# Patient Record
Sex: Female | Born: 1970
Health system: Southern US, Community
[De-identification: ages and names within clinical notes are randomized; demographics above are authoritative.]

## PROBLEM LIST (undated history)

## (undated) DIAGNOSIS — F209 Schizophrenia, unspecified: Secondary | ICD-10-CM

## (undated) DIAGNOSIS — E7800 Pure hypercholesterolemia, unspecified: Secondary | ICD-10-CM

## (undated) DIAGNOSIS — F319 Bipolar disorder, unspecified: Secondary | ICD-10-CM

## (undated) DIAGNOSIS — I10 Essential (primary) hypertension: Secondary | ICD-10-CM

---

## 1998-12-22 ENCOUNTER — Encounter: Payer: Self-pay | Admitting: Emergency Medicine

## 1998-12-22 ENCOUNTER — Emergency Department (HOSPITAL_COMMUNITY): Admission: EM | Admit: 1998-12-22 | Discharge: 1998-12-22 | Payer: Self-pay | Admitting: Emergency Medicine

## 1999-07-28 ENCOUNTER — Emergency Department (HOSPITAL_COMMUNITY): Admission: EM | Admit: 1999-07-28 | Discharge: 1999-07-28 | Payer: Self-pay | Admitting: Emergency Medicine

## 1999-11-19 ENCOUNTER — Emergency Department (HOSPITAL_COMMUNITY): Admission: EM | Admit: 1999-11-19 | Discharge: 1999-11-19 | Payer: Self-pay | Admitting: Emergency Medicine

## 1999-11-20 ENCOUNTER — Inpatient Hospital Stay (HOSPITAL_COMMUNITY): Admission: EM | Admit: 1999-11-20 | Discharge: 1999-11-22 | Payer: Self-pay | Admitting: *Deleted

## 2000-12-01 ENCOUNTER — Emergency Department (HOSPITAL_COMMUNITY): Admission: EM | Admit: 2000-12-01 | Discharge: 2000-12-02 | Payer: Self-pay | Admitting: *Deleted

## 2002-01-23 ENCOUNTER — Encounter: Payer: Self-pay | Admitting: Emergency Medicine

## 2002-01-24 ENCOUNTER — Emergency Department (HOSPITAL_COMMUNITY): Admission: EM | Admit: 2002-01-24 | Discharge: 2002-01-24 | Payer: Self-pay | Admitting: Emergency Medicine

## 2002-04-08 ENCOUNTER — Inpatient Hospital Stay (HOSPITAL_COMMUNITY): Admission: EM | Admit: 2002-04-08 | Discharge: 2002-04-12 | Payer: Self-pay | Admitting: Psychiatry

## 2003-02-25 ENCOUNTER — Inpatient Hospital Stay (HOSPITAL_COMMUNITY): Admission: EM | Admit: 2003-02-25 | Discharge: 2003-02-27 | Payer: Self-pay | Admitting: Psychiatry

## 2003-04-05 ENCOUNTER — Emergency Department (HOSPITAL_COMMUNITY): Admission: EM | Admit: 2003-04-05 | Discharge: 2003-04-05 | Payer: Self-pay

## 2003-04-21 ENCOUNTER — Emergency Department (HOSPITAL_COMMUNITY): Admission: EM | Admit: 2003-04-21 | Discharge: 2003-04-21 | Payer: Self-pay | Admitting: Emergency Medicine

## 2004-01-09 ENCOUNTER — Emergency Department (HOSPITAL_COMMUNITY): Admission: EM | Admit: 2004-01-09 | Discharge: 2004-01-09 | Payer: Self-pay | Admitting: Emergency Medicine

## 2004-08-01 ENCOUNTER — Emergency Department (HOSPITAL_COMMUNITY): Admission: EM | Admit: 2004-08-01 | Discharge: 2004-08-02 | Payer: Self-pay | Admitting: Emergency Medicine

## 2004-08-08 ENCOUNTER — Emergency Department (HOSPITAL_COMMUNITY): Admission: EM | Admit: 2004-08-08 | Discharge: 2004-08-08 | Payer: Self-pay | Admitting: Emergency Medicine

## 2004-08-12 ENCOUNTER — Emergency Department (HOSPITAL_COMMUNITY): Admission: EM | Admit: 2004-08-12 | Discharge: 2004-08-12 | Payer: Self-pay | Admitting: Emergency Medicine

## 2004-08-14 ENCOUNTER — Ambulatory Visit: Payer: Self-pay | Admitting: Family Medicine

## 2004-08-20 ENCOUNTER — Emergency Department (HOSPITAL_COMMUNITY): Admission: EM | Admit: 2004-08-20 | Discharge: 2004-08-21 | Payer: Self-pay | Admitting: *Deleted

## 2004-09-24 ENCOUNTER — Inpatient Hospital Stay (HOSPITAL_COMMUNITY): Admission: AD | Admit: 2004-09-24 | Discharge: 2004-09-24 | Payer: Self-pay | Admitting: Obstetrics and Gynecology

## 2004-09-27 ENCOUNTER — Inpatient Hospital Stay (HOSPITAL_COMMUNITY): Admission: AD | Admit: 2004-09-27 | Discharge: 2004-09-27 | Payer: Self-pay | Admitting: Internal Medicine

## 2004-09-30 ENCOUNTER — Emergency Department (HOSPITAL_COMMUNITY): Admission: EM | Admit: 2004-09-30 | Discharge: 2004-09-30 | Payer: Self-pay | Admitting: Emergency Medicine

## 2004-10-03 ENCOUNTER — Ambulatory Visit: Payer: Self-pay | Admitting: Family Medicine

## 2004-10-09 ENCOUNTER — Ambulatory Visit (HOSPITAL_COMMUNITY): Admission: RE | Admit: 2004-10-09 | Discharge: 2004-10-09 | Payer: Self-pay | Admitting: Internal Medicine

## 2004-10-23 ENCOUNTER — Ambulatory Visit: Payer: Self-pay | Admitting: Family Medicine

## 2006-10-16 ENCOUNTER — Inpatient Hospital Stay (HOSPITAL_COMMUNITY): Admission: EM | Admit: 2006-10-16 | Discharge: 2006-10-18 | Payer: Self-pay | Admitting: Emergency Medicine

## 2006-10-16 ENCOUNTER — Ambulatory Visit: Payer: Self-pay | Admitting: Internal Medicine

## 2006-10-18 ENCOUNTER — Inpatient Hospital Stay (HOSPITAL_COMMUNITY): Admission: EM | Admit: 2006-10-18 | Discharge: 2006-10-20 | Payer: Self-pay | Admitting: Emergency Medicine

## 2006-11-03 ENCOUNTER — Ambulatory Visit: Payer: Self-pay | Admitting: Family Medicine

## 2007-01-07 ENCOUNTER — Emergency Department (HOSPITAL_COMMUNITY): Admission: EM | Admit: 2007-01-07 | Discharge: 2007-01-07 | Payer: Self-pay | Admitting: Emergency Medicine

## 2007-01-09 ENCOUNTER — Emergency Department (HOSPITAL_COMMUNITY): Admission: EM | Admit: 2007-01-09 | Discharge: 2007-01-09 | Payer: Self-pay | Admitting: Emergency Medicine

## 2007-04-23 ENCOUNTER — Emergency Department (HOSPITAL_COMMUNITY): Admission: EM | Admit: 2007-04-23 | Discharge: 2007-04-23 | Payer: Self-pay | Admitting: Family Medicine

## 2007-05-19 ENCOUNTER — Emergency Department (HOSPITAL_COMMUNITY): Admission: EM | Admit: 2007-05-19 | Discharge: 2007-05-19 | Payer: Self-pay | Admitting: Emergency Medicine

## 2007-09-23 ENCOUNTER — Ambulatory Visit: Payer: Self-pay | Admitting: Internal Medicine

## 2007-09-23 ENCOUNTER — Encounter (INDEPENDENT_AMBULATORY_CARE_PROVIDER_SITE_OTHER): Payer: Self-pay | Admitting: Family Medicine

## 2007-09-23 LAB — CONVERTED CEMR LAB
Albumin: 4 g/dL (ref 3.5–5.2)
Alkaline Phosphatase: 46 units/L (ref 39–117)
BUN: 10 mg/dL (ref 6–23)
Basophils Absolute: 0 10*3/uL (ref 0.0–0.1)
Basophils Relative: 0 % (ref 0–1)
CO2: 22 meq/L (ref 19–32)
Calcium: 9.3 mg/dL (ref 8.4–10.5)
HCT: 29.9 % — ABNORMAL LOW (ref 36.0–46.0)
Hemoglobin: 9.2 g/dL — ABNORMAL LOW (ref 12.0–15.0)
Lymphocytes Relative: 36 % (ref 12–46)
MCHC: 30.8 g/dL (ref 30.0–36.0)
MCV: 69.9 fL — ABNORMAL LOW (ref 78.0–100.0)
Monocytes Absolute: 0.6 10*3/uL (ref 0.1–1.0)
Potassium: 4.4 meq/L (ref 3.5–5.3)
RDW: 15.8 % — ABNORMAL HIGH (ref 11.5–15.5)
TSH: 0.972 microintl units/mL (ref 0.350–5.50)
Total Bilirubin: 0.5 mg/dL (ref 0.3–1.2)
WBC: 5.1 10*3/uL (ref 4.0–10.5)

## 2007-11-10 ENCOUNTER — Encounter (INDEPENDENT_AMBULATORY_CARE_PROVIDER_SITE_OTHER): Payer: Self-pay | Admitting: Family Medicine

## 2007-11-10 ENCOUNTER — Ambulatory Visit: Payer: Self-pay | Admitting: Internal Medicine

## 2007-11-10 LAB — CONVERTED CEMR LAB
Basophils Absolute: 0 10*3/uL (ref 0.0–0.1)
Basophils Relative: 0 % (ref 0–1)
Cholesterol: 183 mg/dL (ref 0–200)
HDL: 31 mg/dL — ABNORMAL LOW (ref >39)
Hemoglobin: 10.1 g/dL — ABNORMAL LOW (ref 12.0–15.0)
Iron: 48 ug/dL (ref 42–145)
LDL Cholesterol: 110 mg/dL — ABNORMAL HIGH (ref 0–99)
Lymphs Abs: 2 10*3/uL (ref 0.7–4.0)
Monocytes Relative: 8 % (ref 3–12)
Neutrophils Relative %: 54 % (ref 43–77)
Platelets: 527 10*3/uL — ABNORMAL HIGH (ref 150–400)
RBC: 4.66 M/uL (ref 3.87–5.11)
RDW: 15.9 % — ABNORMAL HIGH (ref 11.5–15.5)
Triglycerides: 211 mg/dL — ABNORMAL HIGH (ref <150)
UIBC: 422 ug/dL
WBC: 6.1 10*3/uL (ref 4.0–10.5)

## 2007-12-30 ENCOUNTER — Ambulatory Visit: Payer: Self-pay | Admitting: Obstetrics and Gynecology

## 2008-01-06 ENCOUNTER — Ambulatory Visit: Payer: Self-pay | Admitting: Obstetrics and Gynecology

## 2008-01-06 ENCOUNTER — Ambulatory Visit (HOSPITAL_COMMUNITY): Admission: RE | Admit: 2008-01-06 | Discharge: 2008-01-06 | Payer: Self-pay | Admitting: Obstetrics and Gynecology

## 2008-01-06 ENCOUNTER — Encounter: Payer: Self-pay | Admitting: Obstetrics and Gynecology

## 2008-01-12 ENCOUNTER — Emergency Department (HOSPITAL_COMMUNITY): Admission: EM | Admit: 2008-01-12 | Discharge: 2008-01-12 | Payer: Self-pay | Admitting: Emergency Medicine

## 2008-10-06 ENCOUNTER — Emergency Department (HOSPITAL_COMMUNITY): Admission: EM | Admit: 2008-10-06 | Discharge: 2008-10-06 | Payer: Self-pay | Admitting: Family Medicine

## 2008-11-08 ENCOUNTER — Emergency Department (HOSPITAL_COMMUNITY): Admission: EM | Admit: 2008-11-08 | Discharge: 2008-11-08 | Payer: Self-pay | Admitting: Emergency Medicine

## 2008-11-17 ENCOUNTER — Emergency Department (HOSPITAL_COMMUNITY): Admission: EM | Admit: 2008-11-17 | Discharge: 2008-11-17 | Payer: Self-pay | Admitting: Emergency Medicine

## 2009-02-28 ENCOUNTER — Emergency Department (HOSPITAL_COMMUNITY): Admission: EM | Admit: 2009-02-28 | Discharge: 2009-02-28 | Payer: Self-pay | Admitting: Emergency Medicine

## 2009-05-04 ENCOUNTER — Emergency Department (HOSPITAL_COMMUNITY): Admission: EM | Admit: 2009-05-04 | Discharge: 2009-05-04 | Payer: Self-pay | Admitting: Family Medicine

## 2009-11-01 ENCOUNTER — Emergency Department (HOSPITAL_COMMUNITY): Admission: EM | Admit: 2009-11-01 | Discharge: 2009-11-01 | Payer: Self-pay | Admitting: Family Medicine

## 2009-11-07 ENCOUNTER — Ambulatory Visit: Payer: Self-pay | Admitting: Internal Medicine

## 2009-11-08 ENCOUNTER — Encounter (INDEPENDENT_AMBULATORY_CARE_PROVIDER_SITE_OTHER): Payer: Self-pay | Admitting: Adult Health

## 2009-11-08 ENCOUNTER — Ambulatory Visit: Payer: Self-pay | Admitting: Internal Medicine

## 2009-11-08 LAB — CONVERTED CEMR LAB
CO2: 23 meq/L (ref 19–32)
Chloride: 104 meq/L (ref 96–112)
Creatinine, Ser: 0.69 mg/dL (ref 0.40–1.20)
Glucose, Bld: 77 mg/dL (ref 70–99)
Microalb, Ur: 5.6 mg/dL — ABNORMAL HIGH (ref 0.00–1.89)

## 2009-12-09 ENCOUNTER — Emergency Department (HOSPITAL_COMMUNITY): Admission: EM | Admit: 2009-12-09 | Discharge: 2009-12-09 | Payer: Self-pay | Admitting: Emergency Medicine

## 2009-12-26 ENCOUNTER — Encounter (INDEPENDENT_AMBULATORY_CARE_PROVIDER_SITE_OTHER): Payer: Self-pay | Admitting: Adult Health

## 2009-12-26 ENCOUNTER — Ambulatory Visit: Payer: Self-pay | Admitting: Internal Medicine

## 2009-12-26 LAB — CONVERTED CEMR LAB
ALT: 13 units/L (ref 0–35)
AST: 21 units/L (ref 0–37)
Alkaline Phosphatase: 41 units/L (ref 39–117)
BUN: 15 mg/dL (ref 6–23)
CO2: 23 meq/L (ref 19–32)
Calcium: 10 mg/dL (ref 8.4–10.5)
Cholesterol: 200 mg/dL (ref 0–200)
Creatinine, Ser: 1.02 mg/dL (ref 0.40–1.20)
Potassium: 4.1 meq/L (ref 3.5–5.3)
Sodium: 135 meq/L (ref 135–145)
Total Bilirubin: 0.7 mg/dL (ref 0.3–1.2)

## 2010-01-04 ENCOUNTER — Ambulatory Visit: Payer: Self-pay | Admitting: Family Medicine

## 2010-02-20 ENCOUNTER — Ambulatory Visit: Payer: Self-pay | Admitting: Internal Medicine

## 2010-09-04 ENCOUNTER — Emergency Department (HOSPITAL_COMMUNITY)
Admission: EM | Admit: 2010-09-04 | Discharge: 2010-09-04 | Payer: Self-pay | Source: Home / Self Care | Admitting: Emergency Medicine

## 2010-09-16 ENCOUNTER — Encounter: Payer: Self-pay | Admitting: Internal Medicine

## 2010-12-11 LAB — CULTURE, ROUTINE-ABSCESS

## 2011-01-08 NOTE — Group Therapy Note (Signed)
NAMEYLONDA, STORR NO.:  [id number]   MEDICAL RECORD NO.:  [id number]          PATIENT TYPE:  WOC   LOCATION:  [facility]                   FACILITY:  [facility]   PHYSICIAN:  Argentina Donovan, MD        DATE OF BIRTH:  [birth year]   DATE OF SERVICE:  01/06/2008                                  CLINIC NOTE   The patient is a 40 year old African American female, gravida 3, para 3-  0-0-3, who was referred by [facility] because of possible fibroids with  a history of an ovarian mass.  The ultrasound showed resolution of the  left ovarian cyst since a prior exam 1 year ago, mildly enlarged uterus  with 4 discrete fibroids measuring up to 4.5 cm but no significant  change from the previous year-ago ultrasound.   EXAMINATION:  The external genitalia is normal.  BUS within normal  limits.  Vagina is clean and well-rugated.  Cervix clean, parous.  The  uterus could not be well-outlined cause of habitus of the patient, nor  could the adnexa be palpated.  The cul-de-sac was free.  A Pap smear was  taken.   The patient has no complaints of any abnormal bleeding and will be seen  annually for her Pap smear.   DIAGNOSES:  1. Asymptomatic leiomyomata uteri.  2. Otherwise normal gynecological examination.  3. Resolution of ovarian cyst.  4. Papanicolaou smear taken.           ______________________________  Argentina Donovan, MD     PR/MEDQ  D:  01/06/2008  T:  01/06/2008  Job:  [id number]

## 2011-01-11 NOTE — Discharge Summary (Signed)
Angela Conner, Angela Conner                        ACCOUNT NO.:  [id number]   MEDICAL RECORD NO.:  [id number]                  PATIENT TYPE:  IPS   LOCATION:  0502                                 FACILITY:  [facility]   PHYSICIAN:  Geoffery Lyons, MD                     DATE OF BIRTH:  April 22, 1971   DATE OF ADMISSION:  04/08/2002  DATE OF DISCHARGE:  04/12/2002                                 DISCHARGE SUMMARY   CHIEF COMPLAINT AND PRESENT ILLNESS:  This was the second admission to [facility] for this 40 year old African-American female,  single, voluntarily admitted, who stated that the voices were telling her to  kill herself and her children.  She ingested an unknown amount of ibuprofen  and four Tylenol in an intentional suicide attempt.  She was medically  cleared.  She had a history of hospitalization one time previously and did  not record the medications she was given.  There were other overdose  attempts untreated.  She had 42 years' history of depression,  worse in the past year since she left prison after a 45 day term for  embezzlement.  She had increased irritability, poor sleep, generalized  insomnia, increased anhedonia, poor concentration.  She heard voices telling  her to harm herself or just to leave the house.  She had no visual  hallucinations.  She smoked marijuana, no alcohol or cocaine.   PAST PSYCHIATRIC HISTORY:  She had no current outpatient treatment.  She had  been at [facility] two or three years ago.   SUBSTANCE ABUSE HISTORY:  She abused marijuana.   PAST MEDICAL HISTORY:  Noncontributory.   MEDICATIONS:  She was not taking any medications.   MENTAL STATUS EXAM:  Mental status exam revealed an obese African-American  female.  She was in no acute distress, fully alert, blunted affect, poor eye  contact, apathetic.  She was passively cooperative in manner.  Speech was  normal in tone without pressure.  She did have some  difficulty giving  answers, some thought blocking.  Mood was withdrawn, depressed,  distractible.  She seemed to be suspicious and guarded.  She admitted to  suicidal rumination but could contract for safety.  Cognitive: Cognition was  well preserved.   ADMISSION DIAGNOSES:   AXIS I:  1. Major depression, recurrent with psychotic features.  2. Marijuana abuse.   AXIS II:  No diagnosis.   AXIS III:  1. Hypokalemia.  2. Hypertension by history.  3. Microcytic anemia.  4. Headache.   AXIS IV:  Moderate.   AXIS V:  Global assessment of functioning upon admission 29, highest global  assessment of functioning in the last year 62.   LABORATORY DATA:  Other laboratory workup: CBC: Hemoglobin 9.2, hematocrit  28.0.  Thyroid profile was within normal limits.  EKG: Within normal limits.   HOSPITAL COURSE:  She  was admitted and started in intensive individual and  group psychotherapy.  She was placed on Risperdal 0.25 mg at noon and 0.5 mg  at night, Lexapro 10 mg daily.  She was given iron multivitamin, Toprol XR  25 mg daily.  Risperdal was increased to 1 mg at bedtime, Ambien was  discontinued.  She claimed to have heard voices for a long time but she had  been keeping it from people.  She was frustrated because she could not find  a job, lots of environmental stressors.  As the medications got into her  system, she felt she was feeling better, the voices had improved, more aware  of the need to pursue outpatient treatment.  By August 18, she was much  improved, in full contact with reality, no suicidal ideas, no homicidal  ideas, mood euthymic, reported no auditory hallucinations.  She said she was  ready to go home and pursue outpatient treatment.  As she denied any  suicidal or homicidal ideas and there was no evidence of acute or further  hallucinations, we discharged her to outpatient followup.   DISCHARGE DIAGNOSES:   AXIS I:  1. Major depression, recurrent with psychotic  features.  2. Marijuana abuse.   AXIS II:  No diagnosis.   AXIS III:  1. Anemia.  2. Headaches.  3. Arterial hypertension by history.   AXIS IV:  Moderate.   AXIS V:  Global assessment of functioning upon discharge 50-55.   DISCHARGE MEDICATIONS:  1. Lexapro 10 mg daily.  2. Iron 325 mg one three times a day.  3. Multivitamin one daily.  4. Toprol XL 25 mg daily.  5. Risperdal 1 mg at bedtime.   FOLLOW UP:  She was to follow-up at [facility].                                               Geoffery Lyons, MD    IL/MEDQ  D:  05/10/2002  T:  05/11/2002  Job:  [id number]

## 2011-01-11 NOTE — Discharge Summary (Signed)
NAMERISHIKA, MCCOLLOM              ACCOUNT NO.:  [id number]   MEDICAL RECORD NO.:  [id number]          PATIENT TYPE:  INP   LOCATION:  5703                         FACILITY:  [facility]   PHYSICIAN:  Duncan Dull, M.D.     DATE OF BIRTH:  [birth year]   DATE OF ADMISSION:  10/18/2006  DATE OF DISCHARGE:  10/20/2006                               DISCHARGE SUMMARY   The patient left AMA on February 23 and returned the same day, and she  was readmitted.  However, this is considered only one admission  secondary to one persisting active problem.   DISCHARGE DIAGNOSES:  1. Pneumonia.  2. Bilateral mediastinal adenopathy.  3. Sinus tachycardia  4. Microcytic anemia.  5. Uterine fibroids, calcified.  6. Ovarian loculated cyst.  7. Bipolar disorder.  8. Metabolic acidosis, resolved.  9. Right lateral back pain, improved.   CONDITION ON DISCHARGE:  Improved.   DISCHARGE MEDICATIONS:  1. Avelox 400 mg p.o. daily for 7 days.  2. Albuterol MDI 1 puffs q. 4 h p.r.n. shortness of breath.   FOLLOWUP:  The patient has an appointment with Dr. Margarette Canada at  [facility] as hospital followup on October 22, 2006, at 10 a.m. and  also an appointment with Dr. Okey Dupre or Dr.  Perlie Gold of [facility] on  November 05, 2006, at 1:15 p.m.  At that time Dr. Okey Dupre or Dr. Perlie Gold  should follow up on her OB-GYN problems of calcified fibroids and  ovarian cyst.  Dr. Margarette Canada should check on CBC as well as white count  and also anemia.   PROCEDURES:  1. October 16, 2006, chest x-ray showed slight diffuse bronchitis, a      2 cm right suprahilar ill defined nodule with slight bilateral and      right hilar and mediastinal adenopathy.  Differential diagnoses      included sarcoidosis, lymphoproliferative disease, primary lung      carcinoma, and infectious inflammatory etiology. Stable slight      scoliosis.  2. Abdominal x-ray shows stable calcified uterine fibroids, otherwise      negative.  3. CT of the  chest with contrast showed extensive mediastinal and      hilar adenopathy.  In the differential, sarcoidosis,      lymphoproliferative disease, lymphoma, Castleman disease, and      probably less likely primary lung carcinoma.  Right upper lobar      consolidation most likely representing pneumonia; however,      possibility of other causes; for example  alveolar sarcoid is      consideration as well.  The patient will need to follow up with a      chest x-ray to assess for complete clearing of the consolidation.  4. CT of the abdomen with contrast showed mild hepatomegaly and mild,      diffuse fatty infiltration of the liver.  No space-occupying      hepatic lesions.  Normal spleen.  Mild left periaortic      retroperitoneal adenopathy and no pathologically enlarged abdominal      lymph nodes.  5. Pelvic CT with  contrast showed fibroid uterus, complex left ovarian      mass of indeterminate etiology.  A leading consideration would be      ovarian cyst adenoma.  Cannot exclude cyst adenocarcinoma.      Consider pelvic ultrasound for further evaluation and minimally      prominent size right femoral lymph node.  6. Transvaginal ultrasound on October 17, 2006, showed fibroid      uterus, thickened endometrium measuring up to 2.7 cm, slightly      complex ovarian cyst on the right of 3.7 x 3.6 x 3.5 cm.  It may be      a hemorrhagic cyst.  Ultrasound followup in 3 months is suggested      to reevaluate this cyst.  7. October 20, 2006, a chest x-ray shows improving right upper lobe      airspace consolidation with small right pleural effusion and      possible fluid in the minor fissure.   CONSULTATIONS:  None.   ADMITTING HISTORY OF PRESENT ILLNESS:  Ms.  Decaprio is a 40 year old  African-American woman with past medical history of bipolar disorder and  calcified uterine fibroids by CT of the abdomen in December 2005 also  microcytic anemia secondary to fibroids, presenting to the  ED  complaining of dyspnea, chest tightness, mild productive cough with  white sputum, and shortness of breath since 4 days prior to admission.  Apparently her children were down with a flu-like illness for the week  preceding admission.  The day before admission, her symptoms took a turn  for the worse, and the dyspnea became very severe.  She tried over-the-  counter NyQuil the day prior to admission.  She also had one episode of  nausea but no hematochezia or hematemesis.   PHYSICAL EXAMINATION:  VITAL SIGNS: Temperature 101, blood pressure  145/84, pulse 160, respiratory rate 30, oxygen saturation 98% on room  air.  GENERAL:  She was very tremulous, warm to touch, in moderate distress.  HEENT: Eyes PERRLA.  Extraocular movements intact.  Pallor to  conjunctivae.  ENT: Oropharynx pale, no oral lesions or ulcers.  NECK: Supple, no thyromegaly, no adenopathy.  LUNGS:  Bibasilar breath sounds extending to half of her chest with  crackles and expiratory wheezing diffusely.  CARDIOVASCULAR:  She was tachycardic but regular.  No murmurs, rubs, or  gallops.  Strong bilateral radial pulses.  ABDOMEN: Soft, no guarding to right upper quadrant.  Positive bowel  sounds throughout.  No rebound.  No obvious hepatosplenomegaly.  EXTREMITIES: No edema.  SKIN:  No rashes, no petechiae.  MUSCULOSKELETAL:  Marked tenderness to right posterolateral lumbar  region to very light touch with no skin lesions.  NEUROLOGIC: Cranial nerves II-XII grossly intact.  No focal deficits.  PSYCH:  She was anxious.   LABORATORY DATA ON ADMISSION:  Sodium 133, potassium 3.9, chloride 104,  bicarb 17, BUN 8, creatinine 0.9, glucose 113. Anion gap 12.  Bilirubin  0.7, alkaline phosphatase 46, SGOT 26, SGPT 16, protein 7.2, albumin  3.5.  D-dimer 0.47.  Calcium 9.  PT 14.5, PTT 33, INR 1.1.  White blood  count 18, hemoglobin 10, hematocrit 31, thrombocytes 342, ANC 16.2, MCV 70.7, RDW 16.1.  Point-of-care markers:  Myoglobin 93.8, CK-MB less than  1, troponin I less than 0.05.  Urinalysis: Urine was cloudy, density  1.018, small hemoglobin, small leukocyte esterase, 15 ketones, 0-2 white  blood cells, few bacteria, and 0-2 red blood cells.  Pregnancy test was  negative in both serum and urine.  Urine drug screen was positive for  THC two times, at admission and after she left AMA and returned.   HOSPITAL COURSE:  #1.  UPPER RESPIRATORY INFECTION:  Most likely pneumonia.  Initial ABG  shows picture of respiratory alkalosis with chest x-ray findings  suggesting bronchitis and adenopathy, also concerning for pneumonia in  the setting of increased white blood count, fever, and cough.  We  admitted her to telemetry, started her on empiric antibiotics with  Zithromax and Rocephin that we continued for 3 days.  One day before  discharge, we switched her to Avelox, and we will continue this for 7  days.  Total antibiotic days, 10.  We checked sputum cultures and Gram  stain along with blood cultures and urine cultures.  Blood cultures x3  are negative preliminarily.  Sputum cultures were not done inability to  provide specimen.  Nasal swabs were negative.  We also gave her Xopenex  therapy 3 times a day and q. 4 h p.r.n., also provided her with Tylenol  and Percocet for back pain.  Before discharge, repeat chest x-ray showed  improved right upper lobe infiltrate, right pleural effusion, and fluid  in minor fissure which is maintained.  The patient was feeling much  better right before discharge.  We have checked oxygen saturation with  ambulation and with resting, and she was saturating between 94 and 98 on  room air.   #2.  TACHYCARDIA: She came with a pulse of 160.  She was placed on  cardiac monitor, and we obtained an EKG that showed only sinus  tachycardia, otherwise normal EKG.  Cardiac enzymes were negative, and  she did not complain of specific chest pain.  During her hospital stay,  her pulse  remained around 100, down to 80 when she was sleeping.  However, on the day of discharge, we asked her to ambulate to check her  oxygen saturation, and her pulse oscillated between 100 and 126.  We sat  her down and checked orthostatics, and she was found to be orthostatic  by pulse.  We wanted to keep her in the hospital longer to give her  normal saline bolus, but she refused this because she was in a rush to  go home.  She was advised to drink a lot of fluids.  At this point, we  believe that her tachycardia was probably because of her dehydration.  However, she tested positive for marijuana, and this can be cause, too.   #3.  ANEMIA:  This is microcytic with an MCV of 70.7.  It is a chronic  problem for her and is probably caused by menorrhagia from her uterine  fibroids. We set up an appointment in the OB-GYN clinic to be followed  up for this problem.  On CT, the uterine fibroids appear calcified.  #4.  OVARIAN CYST:  This appears to be loculated cyst on ultrasound, and  we recommend that she be followed for this problem, especially since  this looks suspicious for ovarian cancer.   #5.  HILAR ADENOPATHY:  The patient has several enlarged lymph nodes in  her mediastinum and under the diaphragm.  This is suspicious for  lymphoma or a cancerous process or sarcoidosis.  We have checked an ACE  level that was normal, so we cannot be sure at this point about the  etiology.  This needs to be investigated actively in the future.  She  will need to  have another CT of her chest in a month to see if the  lymphadenopathy resolves along with the pneumonia.  Also, we suggested  the patient have lymph node biopsy preferably to delineate diagnosis.  Of note, we have checked an HIV test in the hospital, and that was  nonreactive.   #6.  DISPOSITION:  The patient left AMA on the third day after  admission, and she presented the same day, going through the ED and  returning to the teaching  service.  She returned for increased shortness  of breath and for wheezing.  In the ED, she received Solu-Medrol 125 mg  IV, and then on the floor she was placed on the same regimen of  antibiotics she was on before.   #7.  PROPHYLAXIS:  She was on PAS hose for DVT prophylaxis and Protonix  for GI prophylaxis.   LABORATORY DATA AT DISCHARGE:  T-max 98.5.  Blood pressure 139-167  systolic and diastolic 85-93.  Pulse 70-81; however, with ambulation,  pulse increased 100-126.  Respiratory rate 18.  Oxygen saturation was  99% on room air, and she was 94-98% with ambulation.  White blood count  11.2 which is down from 18 on admission.  Hemoglobin 8.6, hematocrit 26.5, MCV 70.1, thrombocytes 512.  Sodium  136, potassium 4.1, chloride 107, bicarb 25, BUN 7, creatinine 0.69,  glucose 87, calcium 9.1.  TSH 1, free T4 1.01.  Ferritin in blood was  20.  Reticulocyte count 1.2%, absolute of 52.1.      Carlus Pavlov, M.D.  Electronically Signed      Duncan Dull, M.D.  Electronically Signed    CG/MEDQ  D:  10/21/2006  T:  10/21/2006  Job:  [id number]   cc:   Clement Husbands, M.D.  Phil D. Okey Dupre, M.D.

## 2011-01-11 NOTE — H&P (Signed)
Angela Conner, Angela Conner                        ACCOUNT NO.:  [id number]   MEDICAL RECORD NO.:  [id number]                   PATIENT TYPE:  IPS   LOCATION:  0406                                 FACILITY:  [facility]   PHYSICIAN:  Geoffery Lyons, M.D.                   DATE OF BIRTH:  11/25/1970   DATE OF ADMISSION:  02/25/2003  DATE OF DISCHARGE:                         PSYCHIATRIC ADMISSION ASSESSMENT   IDENTIFYING INFORMATION:  This is a 40 year old single African American  female voluntarily admitted February 25, 2003.   HISTORY OF PRESENT ILLNESS:  The patient presents with a history of  psychosis, having positive auditory hallucinations of the command type, to  jump off a bridge, run into a river.  She states she is hearing her  grandmother's voice who has passed away.  Also experiencing positive visual  hallucinations, seeing shadows.  She reports some depressive symptoms about  lots of things, but did not elaborate.  The patient did mention problems  with finances.  Her sleep has been decreased to about five hours per night.  Appetite has been fair.  She reports positive auditory hallucinations  currently.   PAST PSYCHIATRIC HISTORY:  Second hospitalization to [facility] this year and 2003, for history of an overdose of Tylenol, and  whatever.   SOCIAL HISTORY:  This is a 40 year old single [place] American female with  three children, ages 67, 68, and 5.  The children are currently with her  girlfriend while she is here.  She lives with her girlfriend.  She is not  working.  She has no legal problems.  She has completed 12 years of  schooling.   FAMILY HISTORY:  Denies.   ALCOHOL AND DRUG HISTORY:  The patient smokes.  Denies any alcohol or drug  use.  She uses cocaine on occasion.  She states she last used two days ago.   PRIMARY CARE PHYSICIAN:  None.   PAST MEDICAL HISTORY:  She reports elevated blood pressure but is not on any  hypertensive medications, but the  patient has been noncompliant with  medications.   DRUG ALLERGIES:  No known allergies.   REVIEW OF SYSTEMS:  The patient reports history of hypertension.  She is a  smoker.  She has smoked heavily for the past eight years.  No neurological  or hematological problems.  No GI or GU.  Last menstrual period was in June.  No musculoskeletal or skin problems.   PHYSICAL EXAMINATION:  VITAL SIGNS:  Height 5 feet 7 inches, weight 321  pounds, last temperature 97.3, heart rate 79, respirations 18, blood  pressure 159/99.  GENERAL:  This is an overweight 40 year old African American female.  Hair  is short, clean.  Negative lymphadenopathy.  CHEST:  Clear to auscultation.  HEART:  Rate is regular rate and rhythm.  ABDOMEN:  Obese, soft, nontender.  NEUROLOGICAL:  Muscular strength and tone is equal bilaterally.  Cranial  nerves are grossly intact.  Able to perform heel-to-shin, normal alternating  movements without any difficulty.  The patient does have tattoos noted to  her forearms.   LABORATORY DATA:  Blood work is ordered to be drawn.   MENTAL STATUS EXAMINATION:  She is an alert, overweight, cooperative,  female, little eye contact.  She is disheveled.  Speech is clear and  concrete.  Mood is anxious.  Mild anxiety noted.  Thought processes,  positive auditory hallucinations, positive visualizations.  No delusions.  No paranoia.  No suicidal or homicidal ideation.  She does not appear to be  actively responding to internal stimuli.  Cognitive function is intact.  Memory is fair.  Judgment is fair.  Insight is fair.   DIAGNOSES:   AXIS I:  1. Rule out schizoaffective disorder.  2. Rule out major depression and psychosis.  3. Rule out substance induced mood disorder.   AXIS II:  Deferred.   AXIS III:  Hypertension per history.   AXIS IV:  Other psychosocial problems.   AXIS V:  1. Current:  30  2. Past year:  82   PLAN:  Involuntary admission for depression and psychosis.   Contract for  safety.  Check q.15 minutes.  Will put the patient on the 400 hall.  Will  stabilize mood and thinking so patient can be safe.  Will initiate  antidepressant and antipsychotic therapy to decrease depressive symptoms.  Monitor blood pressure closely.  Consider session with patient's significant  other.  Medication compliance was discussed.   LENGTH OF STAY:  Tentatively 3-5 days.     Landry Corporal, N.P.                       Geoffery Lyons, M.D.    JO/MEDQ  D:  02/27/2003  T:  02/27/2003  Job:  [id number]

## 2011-01-11 NOTE — Discharge Summary (Signed)
Angela Conner, Angela Conner                        ACCOUNT NO.:  [id number]   MEDICAL RECORD NO.:  [id number]                   PATIENT TYPE:  IPS   LOCATION:  0406                                 FACILITY:  [facility]   PHYSICIAN:  Geoffery Lyons, M.D.                   DATE OF BIRTH:  [birth year]   DATE OF ADMISSION:  02/25/2003  DATE OF DISCHARGE:  02/27/2003                                 DISCHARGE SUMMARY   CHIEF COMPLAINT AND PRESENT ILLNESS:  This is the second admission to [facility] for this 40 year old single African-American female,  voluntarily admitted.  History of psychosis, having positive auditory  hallucinations of command-type, jump off a bridge into a river.  She was  hearing her grandmother's voice, __________ visual hallucinations, seeing  shadows.  Depressive symptoms over lots of things, did not elaborate,  primarily finances.  Decreased sleep.   PAST PSYCHIATRIC HISTORY:  This is her second time to [facility].  History of overdose of Tylenol.   ALCOHOL AND DRUG HISTORY:  Smokes.  Denies any alcohol or drug use.  Uses  cocaine on occasion.   PAST MEDICAL HISTORY:  Elevated blood pressure, not treated.   MEDICATIONS:  She is not taking any medications.   PHYSICAL EXAMINATION:  Physical examination was performed, failed to show  any acute findings.   MENTAL STATUS EXAM:  Reveals an alert, overweight, cooperative female with  good eye contact, disheveled.  Speech is clear, concrete.  Mood is anxious;  mild anxiety noted.  Thought processes are positive for auditory  hallucinations as well as visual hallucinations; no delusions, no paranoia,  no suicidal or homicidal ideas.  Did not appear to be responding to internal  stimuli.   ADMISSION DIAGNOSES:   AXIS I:  1. The patient with psychosis versus schizo-affective disorder.  2. Rule out cocaine abuse.   AXIS II:  No diagnosis.   AXIS III:  Hypertension.   AXIS IV:  Moderate.   AXIS  V:  1. Upon admission 30.  2. Global assessment of functioning last year was 60.   HOSPITAL COURSE:  She was admitted and started in intensive individual and  group psychotherapy.  She was placed on Lexapro 5 mg daily, Seroquel 100 mg  at night, Seroquel 50 mg every six hours as needed, and Ativan 0.5 mg every  six hours.  Seroquel was increased to 150 mg per day.  She continued to  endorse a depressed mood and auditory hallucinations, multiple stressors.  She was going to go home under the care of her friend; they were aware of  the increased stress.  I increased Seroquel to 150 mg.  By February 27, 2003, she  was feeling better, denied any auditory hallucinations, no suicidal and no  homicidal ideas, willing to participate in outpatient treatment and to give  the medication a try.  As she was not  suicidal or homicidal and she was  endorsing no further suicidal ideas, we went ahead and discharged to  outpatient followup.   DISCHARGE DIAGNOSES:   AXIS I:  Major depression, recurrent, with psychotic features.   AXIS II:  No diagnosis.   AXIS III:  Hypertension.   AXIS IV:  Moderate.   AXIS V:  Upon discharge 50.   DISCHARGE MEDICATIONS:  1. Lexapro 10 mg per day.  2. Seroquel 100 mg two at bedtime and 25 mg two in the morning.   FOLLOW UP:  Followup with [facility].                                               Geoffery Lyons, M.D.    IL/MEDQ  D:  03/23/2003  T:  03/24/2003  Job:  [id number]

## 2011-01-11 NOTE — H&P (Signed)
NAMEJOSELLE, DEEDS                        ACCOUNT NO.:  [id number]   MEDICAL RECORD NO.:  [id number]                   PATIENT TYPE:  IPS   LOCATION:  0502                                 FACILITY:  [facility]   PHYSICIAN:  Viviann Spare, NP                DATE OF BIRTH:  23-Nov-1970   DATE OF ADMISSION:  04/08/2002  DATE OF DISCHARGE:  04/12/2002                         PSYCHIATRIC ADMISSION ASSESSMENT   DATE OF ASSESSMENT:  April 09, 2002   CHIEF COMPLAINT:  The patient states today The voices tell me to kill  myself and my children.  I have a split personality.  Sometimes I'm angry,  and sometimes I'm nice.   PATIENT IDENTIFICATION:  This a 40 year old African-American female, single,  voluntary admission.   HISTORY OF PRESENT ILLNESS:  This patient present to the emergency room  after ingesting an unknown amount of ibuprofen or Tylenol tablets in an  intentional suicide attempt.  She was medically cleared in the emergency  room.  She has a prior history of hospitalization one time previously but  she does not recall what medication she was given.  She reports other  overdose attempts that have been untreated.  She reports a two to three year  history of depression that has been worse in the past year since she left  prison after a 45 day term for embezzlement.  She reports over the past  month or two, sharply increasing irritability with poor sleep, generalized  insomnia, increasing anhedonia, poor concentration, and irritability.  She  reports over the past three to four years when she gets extremely stressed,  she hears voices telling her to harm herself, to just leave the house and  also occasionally reports auditory hallucinations to kill herself and her  children.  She denies any visual hallucinations.  She denies any substance  abuse other than smoking marijuana; she denies any use of alcohol or  cocaine.  She denies any history of mania or feelings of being hyper or  panic attacks.   PAST PSYCHIATRIC HISTORY:  The patient has no current outpatient treatment,  no past outpatient treatment with any regularity.  She reports a history of  one prior admission to [facility] probably two or three years ago for an  overdose attempt.  The patient has no memory of previous drug trials.  This  is her second admission to [facility].  She does  have a history of prior suicide attempts by overdose.  We are unable to  locate an old record at this time.   SUBSTANCE ABUSE HISTORY:  The patient endorses abuse of marijuana, smoking  it approximately two to three times per week.  She denies any other  substance abuse.   PAST MEDICAL HISTORY:  The patient has no regular primary care Mikeila Burgen.  She was seen in the emergency room to rule out an acetaminophen or  salicylate overdose  and was medically cleared.  She denies any current  medical problems.  The patient reports past history of hypertension.  She  was treated with medications in the past.  She reports her menses are  regular, seven days each month and quite heavy with her last menstrual  period starting February 27, 2002.  She denies any risk of pregnancy, denies any  risk of sexually transmitted disease.  She uses no contraception.   DRUG ALLERGIES:  None.   REVIEW OF SYSTEMS:  Reveals some chronic constipation, normally having a BM  one time weekly.  The patient complains of chronic headache.   PHYSICAL EXAMINATION:  The patient's physical examination was done at [facility] and was generally unremarkable.   VITAL SIGNS:  On admission to the unit, temperature 98.8, pulse 72,  respirations 24, blood pressure 164/98.  She is 5 feet 7 inches tall and  weighs 214 pounds.   LABORATORY DATA:  The patient's urine drug screen was positive for THC.  It  is noted that she is mildly hypokalemic with a potassium of 3.2.  Her  metabolic panel was otherwise within normal limits.   Glucose was very mildly  elevated at 112 and this was a random specimen taken at approximately 7:00  p.m.  Electrolytes were otherwise normal.  BUN 10, creatinine 0.7.  Liver  enzymes: Normal with an SGOT 25, SGPT 17.  Acetaminophen level was less than  10 in the emergency room and salicylate level less than 4.  Her alcohol  level was less than 5.  Her thyroid panel is currently pending as is her  urinalysis and pregnancy test.   SOCIAL HISTORY:  The patient is originally from [place].  She was raised in  [place].  She is a Engineer, agricultural, single mother.  She has  three children ages 71, 55, and 4 years.  She reports that she currently in a  same sex relationship with a friend and her friend's son, with six  individuals currently in the household.  She does have a history of legal  charges for embezzlement from a job at [employer] and had a 45 day prison  term in 2002.  She denies any other legal charges at this time.  She does  endorse financial stresses, having the only check she currently receives is  from Aid to Dependent Children for her 10 year old.  She receives no other  support for the other two and she receives no check herself.  She has been  unable to find employment since she incurred legal charges.  Her friend that  she lives with is supportive of her and she reports her as an asset.   FAMILY HISTORY:  The patient denies any family history of mental illness or  substance abuse.   MENTAL STATUS EXAM:  This is an obese African-American female who appears to  be her stated age.  She is in no acute distress.  She is fully alert with a  blunted affect and poor eye contact, generally seems apathetic and she is  passively cooperative in manner.  Speech is normal in tone without pressure.  She does have some alogia.  It is very difficult to get her to answer.  This  could be related to some thought blocking.  Mood is withdrawn and depressed. She is mildly guarded and  somewhat irritable.  Thought process is remarkable  for some mild thought blocking.  She is also distractible; could be having  auditory hallucinations but this is unclear as she is actually denying those  at this time.  She does display some mild paranoia and guarding in her  manner.  Her tracking of the conversation is poor.  She is positive for  suicidal ideation and homicidal ideation with no specific intent at this  time or plan.  Cognitive: Intact and oriented x 3.  Insight is poor.  She is  able to contract for safety.  Intelligence is average.  Judgment and impulse  control: Questionable.   ADMISSION DIAGNOSES:  Axis I: 1.  Major depression, recurrent, severe with  psychosis.  1. Tetrahydrocannibinol abuse.  Axis II:    Deferred.  Axis III:   1.  Hypokalemia.  1. Hypertension by history.  2. Microcytic anemia.  3. Headache, not otherwise specified.  4. Constipation, not otherwise specified.  Axis IV:    Moderate occupational problems, having no regular job and  inadequate income.  Having a supportive friend and living situation is an  asset to her.  Axis V:     Current 29, past year 83.   INITIAL PLAN OF CARE:  Plan is to voluntarily admit the patient to stabilize  her mood and to alleviate her auditory hallucinations and to alleviate her  suicidal and homicidal ideation and improve her sleep and neurovegetative  symptoms.  We are going to check a baseline EKG on her.  We will start her  on Risperdal 0.25 mg p.o. at noon and 0.5 mg at h.s. to alleviate her  guardedness, vague paranoia, and her auditory hallucinations and we will  start her on Lexapro 10 mg p.o. q.d. to alleviate her depression.  We will  also check a urine pregnancy test on her and a routine urinalysis.  For her  microcytic anemia, we will check a ferratin level on her and an RBC folate  and will start her on FeSO4 325 mg p.o. t.i.d.  We will add Colace 100 mg  b.i.d. to that because of her problems with  somewhat chronic constipation  and will also put her on Lactulose 1 Tbs daily as needed to prevent  constipation.  For her hypertension, we are going to start her on Toprol XR  25 mg p.o. daily and will watch her vital signs closely.  We will start her  on Gatorade one bottle q.i.d. for her hypokalemia.  We have also started the  patient on Ambien 10 mg p.o. p.r.n. for insomnia.   ESTIMATED LENGTH OF STAY:  Five days.                                               Viviann Spare, NP    MAS/MEDQ  D:  04/09/2002  T:  04/14/2002  Job:  [id number]

## 2011-02-28 ENCOUNTER — Emergency Department (HOSPITAL_COMMUNITY)
Admission: EM | Admit: 2011-02-28 | Discharge: 2011-03-01 | Disposition: A | Discharge status: Home / Self Care | Payer: Medicare Other | Attending: Emergency Medicine | Admitting: Emergency Medicine

## 2011-02-28 DIAGNOSIS — F411 Generalized anxiety disorder: Secondary | ICD-10-CM | POA: Insufficient documentation

## 2011-02-28 DIAGNOSIS — R45851 Suicidal ideations: Secondary | ICD-10-CM | POA: Insufficient documentation

## 2011-02-28 DIAGNOSIS — R443 Hallucinations, unspecified: Secondary | ICD-10-CM | POA: Insufficient documentation

## 2011-02-28 LAB — COMPREHENSIVE METABOLIC PANEL
ALT: 13 U/L (ref 0–35)
AST: 21 U/L (ref 0–37)
Albumin: 3.6 g/dL (ref 3.5–5.2)
BUN: 9 mg/dL (ref 6–23)
CO2: 25 mEq/L (ref 19–32)
Calcium: 8.8 mg/dL (ref 8.4–10.5)
Chloride: 103 mEq/L (ref 96–112)
GFR calc Af Amer: >60 mL/min (ref >60)
GFR calc non Af Amer: >60 mL/min (ref >60)
Potassium: 3.2 mEq/L — ABNORMAL LOW (ref 3.5–5.1)
Sodium: 138 mEq/L (ref 135–145)
Total Protein: 7.4 g/dL (ref 6.0–8.3)

## 2011-02-28 LAB — CBC
Hemoglobin: 8.9 g/dL — ABNORMAL LOW (ref 12.0–15.0)
MCHC: 32.8 g/dL (ref 30.0–36.0)
MCV: 70.6 fL — ABNORMAL LOW (ref 78.0–100.0)
RBC: 3.84 MIL/uL — ABNORMAL LOW (ref 3.87–5.11)
RDW: 15.1 % (ref 11.5–15.5)

## 2011-02-28 LAB — DIFFERENTIAL
Basophils Relative: 0 % (ref 0–1)
Eosinophils Absolute: 0.2 10*3/uL (ref 0.0–0.7)
Eosinophils Relative: 5 % (ref 0–5)
Lymphocytes Relative: 46 % (ref 12–46)
Monocytes Absolute: 0.3 10*3/uL (ref 0.1–1.0)
Neutro Abs: 1.9 10*3/uL (ref 1.7–7.7)

## 2011-02-28 LAB — RAPID URINE DRUG SCREEN, HOSP PERFORMED: Tetrahydrocannabinol: POSITIVE — AB

## 2011-03-01 ENCOUNTER — Inpatient Hospital Stay (HOSPITAL_COMMUNITY): Admission: AD | Admit: 2011-03-01 | Payer: Medicare Other | Source: Home / Self Care | Admitting: Psychiatry

## 2011-04-22 ENCOUNTER — Inpatient Hospital Stay (INDEPENDENT_AMBULATORY_CARE_PROVIDER_SITE_OTHER)
Admission: RE | Admit: 2011-04-22 | Discharge: 2011-04-22 | Disposition: A | Discharge status: Home / Self Care | Payer: Medicaid Other | Source: Ambulatory Visit | Attending: Family Medicine | Admitting: Family Medicine

## 2011-04-22 DIAGNOSIS — H109 Unspecified conjunctivitis: Secondary | ICD-10-CM

## 2011-06-06 LAB — I-STAT 8, (EC8 V) (CONVERTED LAB)
BUN: 16
Bicarbonate: 29.9 — ABNORMAL HIGH
Chloride: 99
Glucose, Bld: 101 — ABNORMAL HIGH
HCT: 35 — ABNORMAL LOW
Operator id: 116391
pCO2, Ven: 42.6 — ABNORMAL LOW
pH, Ven: 7.454 — ABNORMAL HIGH

## 2011-06-06 LAB — POCT PREGNANCY, URINE: Operator id: 235561

## 2011-07-22 ENCOUNTER — Emergency Department (INDEPENDENT_AMBULATORY_CARE_PROVIDER_SITE_OTHER)
Admission: EM | Admit: 2011-07-22 | Discharge: 2011-07-22 | Disposition: A | Discharge status: Home / Self Care | Payer: Medicare Other | Source: Home / Self Care

## 2011-07-22 DIAGNOSIS — L723 Sebaceous cyst: Secondary | ICD-10-CM

## 2011-07-22 DIAGNOSIS — I889 Nonspecific lymphadenitis, unspecified: Secondary | ICD-10-CM

## 2011-07-22 HISTORY — DX: Essential (primary) hypertension: I10

## 2011-07-22 MED ORDER — CEPHALEXIN 500 MG PO CAPS: 500.0000 mg | ORAL_CAPSULE | Freq: Three times a day (TID) | ORAL | Status: AC

## 2011-07-22 NOTE — ED Provider Notes (Signed)
Medical screening examination/treatment/procedure(s) were performed by non-physician practitioner and as supervising physician I was immediately available for consultation/collaboration.   Barkley Bruns MD.    Barkley Bruns, MD 07/22/11 [id number]

## 2011-07-22 NOTE — ED Provider Notes (Signed)
History     CSN: [id number] Arrival date & time: 07/22/2011 12:35 PM   None     Chief Complaint  Patient presents with  . Mass    (Consider location/radiation/quality/duration/timing/severity/associated sxs/prior treatment) HPI Comments: Pt states she has a new lump behind her left ear and under her chin for the last couple of days. No change in size since she discovered them. Has had a lump on her back for a couple of mos - she wonders if they may be related. The lump on her back occasionally itches but is not painful and she has not noticed a change in size. No drainage. She has had some nasal congestion for a few days, but denies sore throat or ear pain. No fever.   The history is provided by the patient.    Past Medical History  Diagnosis Date  . Hypertension     History reviewed. No pertinent past surgical history.  History reviewed. No pertinent family history.  History  Substance Use Topics  . Smoking status: Former Games developer  . Smokeless tobacco: Not on file  . Alcohol Use: No    OB History    Grav Para Term Preterm Abortions TAB SAB Ect Mult Living                  Review of Systems  Constitutional: Negative for fever, chills and fatigue.  HENT: Positive for congestion. Negative for ear pain, sore throat, rhinorrhea, sneezing, postnasal drip and sinus pressure.   Respiratory: Negative for cough and shortness of breath.   Cardiovascular: Negative for chest pain.  Skin: Negative for rash.  Hematological: Positive for adenopathy.    Allergies  Review of patient's allergies indicates no known allergies.  Home Medications   Current Outpatient Rx  Name Route Sig Dispense Refill  . ATENOLOL 100 MG PO TABS Oral Take 100 mg by mouth daily.      . BUPROPION HCL 100 MG PO TABS Oral Take 100 mg by mouth 2 (two) times daily.      . CEPHALEXIN 500 MG PO CAPS Oral Take 1 capsule (500 mg total) by mouth 3 (three) times daily. 30 capsule 0    BP 135/90  Pulse 60   Temp(Src) 98.2 F (36.8 C) (Oral)  Resp 16  SpO2 100%  Physical Exam  Nursing note and vitals reviewed. Constitutional: She appears well-developed and well-nourished. No distress.  HENT:  Head: Normocephalic and atraumatic.  Right Ear: Tympanic membrane, external ear and ear canal normal.  Left Ear: Tympanic membrane, external ear and ear canal normal.  Nose: Nose normal.  Mouth/Throat: Uvula is midline, oropharynx is clear and moist and mucous membranes are normal. No oropharyngeal exudate, posterior oropharyngeal edema or posterior oropharyngeal erythema.  Neck: Neck supple.  Cardiovascular: Normal rate, regular rhythm and normal heart sounds.   Pulmonary/Chest: Effort normal and breath sounds normal. No respiratory distress.  Lymphadenopathy:       Head (right side): No submandibular, no tonsillar, no preauricular, no posterior auricular and no occipital adenopathy present.       Head (left side): Posterior auricular (< 1 cm, smooth and nontender) adenopathy present. No submandibular, no tonsillar, no preauricular and no occipital adenopathy present.    She has no cervical adenopathy.       Right: No supraclavicular adenopathy present.       Left: No supraclavicular adenopathy present.       2 cm mildly tender submental node, smooth and mobile.   Neurological: She  is alert.  Skin: Skin is warm and dry.     Psychiatric: She has a normal mood and affect.    ED Course  Procedures (including critical care time)  Labs Reviewed - No data to display No results found.   1. Lymphadenitis   2. Sebaceous cyst       MDM          Melody Comas, PA 07/22/11 1359

## 2011-07-22 NOTE — ED Notes (Signed)
C/o lump on her back for couple of months (no changes) lump behind her left ear and under chin for couple of days; c/o URI syx and iritis (as per reported eye MD dx)

## 2011-09-05 ENCOUNTER — Encounter (HOSPITAL_COMMUNITY): Payer: Self-pay | Admitting: Emergency Medicine

## 2011-09-05 ENCOUNTER — Emergency Department (INDEPENDENT_AMBULATORY_CARE_PROVIDER_SITE_OTHER)
Admission: EM | Admit: 2011-09-05 | Discharge: 2011-09-05 | Disposition: A | Discharge status: Home / Self Care | Payer: Medicare Other | Source: Home / Self Care | Attending: Family Medicine | Admitting: Family Medicine

## 2011-09-05 DIAGNOSIS — L259 Unspecified contact dermatitis, unspecified cause: Secondary | ICD-10-CM

## 2011-09-05 DIAGNOSIS — L309 Dermatitis, unspecified: Secondary | ICD-10-CM

## 2011-09-05 MED ORDER — HYDROXYZINE HCL 25 MG PO TABS: 25.0000 mg | ORAL_TABLET | Freq: Four times a day (QID) | ORAL | Status: AC | PRN

## 2011-09-05 MED ORDER — TRIAMCINOLONE ACETONIDE 0.1 % EX CREA: TOPICAL_CREAM | Freq: Two times a day (BID) | CUTANEOUS | Status: DC

## 2011-09-05 NOTE — ED Notes (Signed)
PT HERE WITH C/O POS INSECT BITE TO LEFT POST ARM AND X1 TO RIGHT ARM WITH BURN,ITCHY PAIN THAT FLARED UP X 2 DYS.ON EXAM APPEARS TO BE RED RASH.PT USED IBUPROFEN AND HYDROCORTISONE CREAM BUT NOT RELIEVING PAIN

## 2011-09-05 NOTE — ED Provider Notes (Signed)
History     CSN: [id number]  Arrival date & time 09/05/11  1302   First MD Initiated Contact with Patient 09/05/11 1546      Chief Complaint  Patient presents with  . Insect Bite  . Rash    (Consider location/radiation/quality/duration/timing/severity/associated sxs/prior treatment) HPI Comments: Angela Conner presents for evaluation of itchy rash on the posterior aspect of her LEFT upper arm. She denies any new exposure. Reports that it started as itchy, but now burns. She denies any other areas on her body except a single itchy lesion on medial RIGHT upper arm.   Patient is a 41 y.o. female presenting with rash. The history is provided by the patient.  Rash  This is a new problem. The current episode started more than 2 days ago. The problem has not changed since onset.The problem is associated with an unknown factor. There has been no fever. The rash is present on the left arm and right arm. The pain is mild. The pain has been constant since onset. Associated symptoms include itching and pain. She has tried steriods for the symptoms.    Past Medical History  Diagnosis Date  . Hypertension     History reviewed. No pertinent past surgical history.  No family history on file.  History  Substance Use Topics  . Smoking status: Former Games developer  . Smokeless tobacco: Not on file  . Alcohol Use: No    OB History    Grav Para Term Preterm Abortions TAB SAB Ect Mult Living                  Review of Systems  Constitutional: Negative.   HENT: Negative.   Eyes: Negative.   Respiratory: Negative.   Cardiovascular: Negative.   Gastrointestinal: Negative.   Genitourinary: Negative.   Musculoskeletal: Negative.   Skin: Positive for itching and rash.  Neurological: Negative.     Allergies  Review of patient's allergies indicates no known allergies.  Home Medications   Current Outpatient Rx  Name Route Sig Dispense Refill  . ATENOLOL 100 MG PO TABS Oral Take 100 mg by mouth  daily.      . BUPROPION HCL 100 MG PO TABS Oral Take 100 mg by mouth 2 (two) times daily.      Marland Kitchen HYDROXYZINE HCL 25 MG PO TABS Oral Take 1 tablet (25 mg total) by mouth every 6 (six) hours as needed for itching. 12 tablet 0  . TRIAMCINOLONE ACETONIDE 0.1 % EX CREA Topical Apply topically 2 (two) times daily. 45 g 0    BP 133/84  Pulse 87  Temp(Src) 98.1 F (36.7 C) (Oral)  Resp 18  SpO2 99%  Physical Exam  Nursing note and vitals reviewed. Constitutional: She is oriented to person, place, and time. She appears well-developed and well-nourished.  HENT:  Head: Normocephalic and atraumatic.  Eyes: EOM are normal.  Neck: Normal range of motion.  Pulmonary/Chest: Effort normal.  Musculoskeletal: Normal range of motion.  Neurological: She is alert and oriented to person, place, and time.  Skin: Skin is warm and dry. Rash noted. Rash is papular. Rash is not pustular and not vesicular.     Psychiatric: Her behavior is normal.    ED Course  Procedures (including critical care time)  Labs Reviewed - No data to display No results found.   1. Dermatitis       MDM  rx given for triamcinolone and hydroxyzine        Richardo Priest, MD 09/05/11  1647 

## 2011-11-27 ENCOUNTER — Emergency Department (HOSPITAL_COMMUNITY)
Admission: EM | Admit: 2011-11-27 | Discharge: 2011-11-27 | Disposition: A | Discharge status: Home / Self Care | Payer: Medicare Other | Attending: Emergency Medicine | Admitting: Emergency Medicine

## 2011-11-27 ENCOUNTER — Encounter (HOSPITAL_COMMUNITY): Payer: Self-pay

## 2011-11-27 DIAGNOSIS — R739 Hyperglycemia, unspecified: Secondary | ICD-10-CM

## 2011-11-27 DIAGNOSIS — R7309 Other abnormal glucose: Secondary | ICD-10-CM | POA: Insufficient documentation

## 2011-11-27 DIAGNOSIS — R0981 Nasal congestion: Secondary | ICD-10-CM

## 2011-11-27 DIAGNOSIS — I10 Essential (primary) hypertension: Secondary | ICD-10-CM | POA: Insufficient documentation

## 2011-11-27 DIAGNOSIS — J3489 Other specified disorders of nose and nasal sinuses: Secondary | ICD-10-CM | POA: Insufficient documentation

## 2011-11-27 DIAGNOSIS — Z87891 Personal history of nicotine dependence: Secondary | ICD-10-CM | POA: Insufficient documentation

## 2011-11-27 LAB — GLUCOSE, CAPILLARY: Glucose-Capillary: 140 mg/dL — ABNORMAL HIGH (ref 70–99)

## 2011-11-27 MED ORDER — PSEUDOEPHEDRINE HCL 60 MG PO TABS
60.0000 mg | ORAL_TABLET | ORAL | Status: AC
  Administered 2011-11-27: 60 mg via ORAL
  Filled 2011-11-27: qty 1

## 2011-11-27 MED ORDER — PSEUDOEPHEDRINE HCL 60 MG PO TABS: 60.0000 mg | ORAL_TABLET | Freq: Four times a day (QID) | ORAL | Status: DC | PRN

## 2011-11-27 NOTE — ED Notes (Signed)
Patient presents with nasal congestion, shortness of breath, and dry mouth x several days.

## 2011-11-27 NOTE — ED Provider Notes (Signed)
Medical screening examination/treatment/procedure(s) were performed by non-physician practitioner and as supervising physician I was immediately available for consultation/collaboration.   Leigh-Ann Gisell Buehrle, MD 11/27/11 1949 

## 2011-11-27 NOTE — ED Provider Notes (Signed)
History     CSN: [id number]  Arrival date & time 11/27/11  1219   First MD Initiated Contact with Patient 11/27/11 1239      Chief Complaint  Patient presents with  . Shortness of Breath  . Sinusitis    (Consider location/radiation/quality/duration/timing/severity/associated sxs/prior treatment) HPI Comments: Patient reports severe nasal congestion x several days.  States since she has to breathe through her mouth and this is making her mouth dry.  States that it doesn't matter how much she drinks, she feels that her mouth is very dry.  States last night she vomited x 1, contents of her stomach, and this morning thought she was having a panic attack because she couldn't breath through her nose.  Has been using multiple nasal sprays including Afrin without relief.   Denies fevers, sinus pain, sore throat, cough, SOB.    Patient is a 41 y.o. female presenting with shortness of breath and sinusitis. The history is provided by the patient.  Shortness of Breath  Pertinent negatives include no chest pain, no fever, no sore throat, no cough and no shortness of breath.  Sinusitis  Associated symptoms include congestion. Pertinent negatives include no sore throat, no cough and no shortness of breath.    Past Medical History  Diagnosis Date  . Hypertension     History reviewed. No pertinent past surgical history.  No family history on file.  History  Substance Use Topics  . Smoking status: Former Games developer  . Smokeless tobacco: Not on file  . Alcohol Use: No    OB History    Grav Para Term Preterm Abortions TAB SAB Ect Mult Living                  Review of Systems  Constitutional: Negative for fever.  HENT: Positive for congestion. Negative for sore throat.   Respiratory: Negative for cough and shortness of breath.   Cardiovascular: Negative for chest pain.  Gastrointestinal: Negative for abdominal pain.  All other systems reviewed and are negative.    Allergies  Review  of patient's allergies indicates no known allergies.  Home Medications   Current Outpatient Rx  Name Route Sig Dispense Refill  . ATENOLOL-CHLORTHALIDONE 50-25 MG PO TABS Oral Take 1 tablet by mouth daily.    . BUPROPION HCL ER (XL) 150 MG PO TB24 Oral Take 150 mg by mouth daily.    . QUETIAPINE FUMARATE 50 MG PO TABS Oral Take 50 mg by mouth at bedtime.      BP 157/88  Pulse 67  Temp(Src) 98.2 F (36.8 C) (Oral)  Resp 20  SpO2 100%  LMP 11/18/2011  Physical Exam  Nursing note and vitals reviewed. Constitutional: She is oriented to person, place, and time. She appears well-developed and well-nourished. No distress.  HENT:  Head: Normocephalic and atraumatic.  Nose: Mucosal edema present. Right sinus exhibits no maxillary sinus tenderness and no frontal sinus tenderness. Left sinus exhibits no maxillary sinus tenderness and no frontal sinus tenderness.  Mouth/Throat: Uvula is midline and oropharynx is clear and moist. No uvula swelling. No oropharyngeal exudate, posterior oropharyngeal edema, posterior oropharyngeal erythema or tonsillar abscesses.       Bilateral mucosal edema.  Nares not patent due to severe edema.    Neck: Trachea normal, normal range of motion and phonation normal. Neck supple. No tracheal tenderness present. No rigidity. No tracheal deviation and normal range of motion present.  Cardiovascular: Normal rate and regular rhythm.   Pulmonary/Chest: Effort normal and  breath sounds normal. No stridor. No respiratory distress. She has no wheezes. She has no rales.  Neurological: She is alert and oriented to person, place, and time.  Skin: She is not diaphoretic.    ED Course  Procedures (including critical care time)  Labs Reviewed  GLUCOSE, CAPILLARY - Abnormal; Notable for the following:    Glucose-Capillary 140 (*)    All other components within normal limits   No results found.   1. Nasal congestion   2. Hyperglycemia       MDM  Patient with  severe nasal congestion.  Pt is afebrile, nontoxic, no sinus tenderness.  Afrin likely not working due to severity of edema.  Mucosa otherwise looks healthy.  PO medications given for congestion.  Pt d/c home with care instructions, to return for worsening condition.  Also hyperglycemic, advised to follow closely with PCP.  Patient verbalizes understanding and agrees with plan.          Rise Patience, [place] 11/27/11 1551

## 2011-11-27 NOTE — ED Notes (Signed)
cbg done/ results 140.

## 2011-11-27 NOTE — Discharge Instructions (Signed)
Please use the prescribed medication in conjunction with Afrin nasal spray to open up your nasal passages.  Keep in mind that you should only use Afrin for 3 days as it will cause a rebound stuffy nose and your body will become dependent on it.   If you develop high fevers, facial pain, or shortness of breath, please return to the ER or see your primary care provider.  You may return to the ER at any time for worsening condition or any new symptoms that concern you.      If you have no primary doctor, here are some resources that may be helpful:  Medicaid-accepting [facility] Providers:   - [facility]- [address]      [phone]      Mon-Fri 9am-7pm, Sat 9am-1pm   - [facility]- [address] [place]      [phone]   - [facility]- [address] [place]      [phone]   [facility]- [address]      [phone]   - Renaye Rakers- [address] [place], [address]      [phone]      Only accepts [place] patients       after they have her name applied to their card   Self Pay (no insurance) in [place]:   - Sickle Cell Patients: Dr Willey Blade, [facility]      [address] [place]      [phone]   - [phone]   - Physician Referral Service- [phone]   - [facility]- [address] [place]      [phone]   [facility]- [address]   - [facility]- see information above      (Speak to [employer] if you do not have insurance)   - [facility]- [address] [place]      [phone]   - [facility] [place]- [address]      [phone]   - [facility]- [address]      [phone]   - Dr Julio Sicks-  [address], [place]      [phone]   - [facility]- [address]      [phone]   - [facility]- [address]      [phone]      Also [address]      [phone]   - [facility]- [address]      [phone]      1st and 3rd Saturday every month, 10am-1pm Other agencies that provide inexpensive medical care:    [facility]  [phone]    [facility]  [phone]    [facility]  [phone]    [facility]  [phone]    [facility]  [phone]  General Information: Finding a doctor when you do not have health insurance can be tricky. Although you are not limited by an insurance plan, you are of course limited by her finances and how much but he can pay out of pocket.  What are your options if you don't have health insurance?   1) Find a Librarian, academic and Pay Out of  Pocket Although you won't have to find out who is covered by your insurance plan, it is a good idea to ask around and get recommendations. You will then need to call the office and see if the doctor you have chosen will accept you as a new patient and what types of options they offer for patients who are self-pay. Some doctors offer discounts or will set up payment plans for their patients who do not have insurance, but you will need to ask so you aren't surprised when you get to your appointment.  2) Contact Your Local Health Department Not all health departments have doctors that can see patients for sick visits, but many do, so it is worth a call to see if yours does. If you don't know where your local health department is, you can check in your phone book. The CDC also has a tool to help you locate your state's health department, and many state websites also have listings of all of their local health departments.  3) Find a Walk-in Clinic If your illness is not likely to be very severe or complicated, you may want to try a walk in clinic. These are popping up all over the country in pharmacies, drugstores, and shopping centers. They're usually staffed by nurse  practitioners or physician assistants that have been trained to treat common illnesses and complaints. They're usually fairly quick and inexpensive. However, if you have serious medical issues or chronic medical problems, these are probably not your best option   High Blood Sugar High blood sugar (hyperglycemia) means that the level of sugar in your blood is higher than it should be. Signs of high blood sugar include:  Feeling thirsty.   Frequent peeing (urinating).   Feeling tired or sleepy.   Dry mouth.   Vision changes.   Feeling weak.   Feeling hungry but losing weight.   Numbness and tingling in your hands or feet.   Headache.  When you ignore these signs, your blood sugar may keep going up. These problems may get worse, and other problems may begin. HOME CARE  Check your blood sugars as told by your doctor. Write down the numbers with the date and time.   Take the right amount of insulin or diabetes pills at the right time. Write down the dose with date and time.   Refill your insulin or diabetes pills before running out.   Watch what you eat. Follow your meal plan.   Drink liquids without sugar, such as water. Check with your doctor if you have kidney or heart disease.   Follow your doctor's orders for exercise. Exercise at the same time of day.   Keep your doctor's appointments.  GET HELP RIGHT AWAY IF:   You have trouble thinking or are confused.   You have fast breathing with fruity smelling breath.   You pass out (faint).   You have 2 to 3 days of high blood sugars and you do not know why.   You have chest pain.   You are feeling sick to your stomach (nauseous) or throwing up (vomiting).   You have sudden vision changes.  MAKE SURE YOU:   Understand these instructions.   Will watch your condition.   Will get help right away if you are not doing well or get worse.  Document Released: 06/09/2009 Document Revised: 08/01/2011 Document Reviewed:  06/09/2009 [facility] Patient Information 2012 [place], [place].

## 2011-12-03 ENCOUNTER — Encounter (HOSPITAL_COMMUNITY): Payer: Self-pay | Admitting: Emergency Medicine

## 2011-12-03 ENCOUNTER — Emergency Department (INDEPENDENT_AMBULATORY_CARE_PROVIDER_SITE_OTHER)
Admission: EM | Admit: 2011-12-03 | Discharge: 2011-12-03 | Disposition: A | Discharge status: Home / Self Care | Payer: Medicare Other | Source: Home / Self Care

## 2011-12-03 DIAGNOSIS — T485X5A Adverse effect of other anti-common-cold drugs, initial encounter: Secondary | ICD-10-CM

## 2011-12-03 DIAGNOSIS — J3489 Other specified disorders of nose and nasal sinuses: Secondary | ICD-10-CM

## 2011-12-03 DIAGNOSIS — K219 Gastro-esophageal reflux disease without esophagitis: Secondary | ICD-10-CM

## 2011-12-03 HISTORY — DX: Schizophrenia, unspecified: F20.9

## 2011-12-03 MED ORDER — OMEPRAZOLE 20 MG PO CPDR: 20.0000 mg | DELAYED_RELEASE_CAPSULE | Freq: Every day | ORAL | Status: DC

## 2011-12-03 MED ORDER — FLUTICASONE PROPIONATE 50 MCG/ACT NA SUSP: 2.0000 | Freq: Every day | NASAL | Status: DC

## 2011-12-03 NOTE — Discharge Instructions (Signed)
You must stop using Afrin or similar nasal sprays. This is causing your nasal congestion and swelling. Your nasal swelling will take a few days to begin improving after stopping use, and may even worsen before improving.  You may use saline nasal spray as needed for dryness. Continue taking Claritin once daily. Begin fluticasone prescription nasal spray today. Keep your appt with your primary care dr at [facility] as planned.  Gastroesophageal Reflux Disease, Adult Gastroesophageal reflux disease (GERD) happens when acid from your stomach goes into your food pipe (esophagus). The acid can cause a burning feeling in your chest. Over time, the acid can make small holes (ulcers) in your food pipe.  HOME CARE  Ask your doctor for advice about:   Losing weight.   Quitting smoking.   Alcohol use.   Avoid foods and drinks that make your problems worse. You may want to avoid:   Caffeine and alcohol.   Chocolate.   Mints.   Garlic and onions.   Spicy foods.   Citrus fruits, such as oranges, lemons, or limes.   Foods that contain tomato, such as sauce, chili, salsa, and pizza.   Fried and fatty foods.   Avoid lying down for 3 hours before you go to bed or before you take a nap.   Eat small meals often, instead of large meals.   Wear loose-fitting clothing. Do not wear anything tight around your waist.   Raise (elevate) the head of your bed 6 to 8 inches with wood blocks. Using extra pillows does not help.   Only take medicines as told by your doctor.   Do not take aspirin or ibuprofen.  GET HELP RIGHT AWAY IF:   You have pain in your arms, neck, jaw, teeth, or back.   Your pain gets worse or changes.   You feel sick to your stomach (nauseous), throw up (vomit), or sweat (diaphoresis).   You feel short of breath, or you pass out (faint).   Your throw up is green, yellow, black, or looks like coffee grounds or blood.   Your poop (stool) is red, bloody, or black.    MAKE SURE YOU:   Understand these instructions.   Will watch your condition.   Will get help right away if you are not doing well or get worse.  Document Released: 01/29/2008 Document Revised: 08/01/2011 Document Reviewed: 03/01/2011 [facility] Patient Information 2012 [place], [place].

## 2011-12-03 NOTE — ED Notes (Signed)
Onset of symptoms one month ago.  Patient c/o congestion, nasal congestion. Denies sore throat, denies ear pain.  Denies fever.  Patient reports multiple complaints.  Heartburn c/o for a few months, dry skin for a few months.  Patient is a client of [facility] with appt for [facility]

## 2011-12-03 NOTE — ED Provider Notes (Signed)
History     CSN: [id number]  Arrival date & time 12/03/11  0955   None     Chief Complaint  Patient presents with  . URI    (Consider location/radiation/quality/duration/timing/severity/associated sxs/prior treatment) HPI Comments: Patient presents today with complaints of nasal congestion and swelling for approximately one month. She states that she has been using Afrin nasal spray for one month, and often multiple times a day. She was seen in the ER 6 days ago. She states she's been taking Claritin and Sudafed daily since then without improvement. She also states that she is sometimes taking more than recommended of these medications . Her nasal mucous is clear with red streaks. Her nose at times feels very dry also. No fever or chills. Patient states that she has been having intermittent heartburn for approximately 4 months, worsening. She has been using condoms. She has an appointment with her primary care provider at [facility] April 19th.    Past Medical History  Diagnosis Date  . Hypertension   . Schizophrenia     History reviewed. No pertinent past surgical history.  No family history on file.  History  Substance Use Topics  . Smoking status: Former Games developer  . Smokeless tobacco: Not on file  . Alcohol Use: No    OB History    Grav Para Term Preterm Abortions TAB SAB Ect Mult Living                  Review of Systems  Constitutional: Negative for fever, chills and fatigue.  HENT: Positive for congestion. Negative for ear pain, sore throat, rhinorrhea, sneezing and sinus pressure.   Respiratory: Negative for cough, shortness of breath and wheezing.   Gastrointestinal: Negative for nausea, vomiting and abdominal pain.    Allergies  Review of patient's allergies indicates no known allergies.  Home Medications   Current Outpatient Rx  Name Route Sig Dispense Refill  . ATENOLOL-CHLORTHALIDONE 50-25 MG PO TABS Oral Take 1 tablet by mouth daily.    . BUPROPION  HCL ER (XL) 150 MG PO TB24 Oral Take 150 mg by mouth daily.    Marland Kitchen FLUTICASONE PROPIONATE 50 MCG/ACT NA SUSP Nasal Place 2 sprays into the nose daily. 16 g 0  . OMEPRAZOLE 20 MG PO CPDR Oral Take 1 capsule (20 mg total) by mouth daily. 30 capsule 0  . QUETIAPINE FUMARATE 50 MG PO TABS Oral Take 50 mg by mouth at bedtime.      BP 132/86  Pulse 98  Temp(Src) 98.3 F (36.8 C) (Oral)  Resp 14  SpO2 100%  LMP 11/18/2011  Physical Exam  Nursing note and vitals reviewed. Constitutional: She appears well-developed and well-nourished. No distress.  HENT:  Head: Normocephalic and atraumatic.  Right Ear: Tympanic membrane, external ear and ear canal normal.  Left Ear: Tympanic membrane, external ear and ear canal normal.  Nose: Mucosal edema (Severe nasal turbinate swelling bilaterally, no erythema or paleness of membranes) present. No rhinorrhea.  Mouth/Throat: Uvula is midline, oropharynx is clear and moist and mucous membranes are normal. No oropharyngeal exudate, posterior oropharyngeal edema or posterior oropharyngeal erythema.  Neck: Neck supple.  Cardiovascular: Normal rate, regular rhythm and normal heart sounds.   Pulmonary/Chest: Effort normal and breath sounds normal. No respiratory distress.  Lymphadenopathy:    She has no cervical adenopathy.  Neurological: She is alert.  Skin: Skin is warm and dry.  Psychiatric: She has a normal mood and affect.    ED Course  Procedures (  including critical care time)  Labs Reviewed - No data to display No results found.   1. Rhinitis medicamentosa   2. GERD (gastroesophageal reflux disease)       MDM  Discussed with pt nasal swelling due to long term use of Afrin nasal spray. That she must immediately d/c use for symptoms to improve. To expect swelling to persist, and possibly worsen for a few days before noticing improvement.          Melody Comas, [place] 12/03/11 1256

## 2011-12-03 NOTE — ED Provider Notes (Signed)
Medical screening examination/treatment/procedure(s) were performed by non-physician practitioner and as supervising physician I was immediately available for consultation/collaboration.  Raynald Blend, MD 12/03/11 1348

## 2011-12-17 ENCOUNTER — Emergency Department (HOSPITAL_COMMUNITY)
Admission: EM | Admit: 2011-12-17 | Discharge: 2011-12-17 | Disposition: A | Discharge status: Home / Self Care | Payer: Medicare Other | Attending: Emergency Medicine | Admitting: Emergency Medicine

## 2011-12-17 ENCOUNTER — Encounter (HOSPITAL_COMMUNITY): Payer: Self-pay | Admitting: *Deleted

## 2011-12-17 DIAGNOSIS — I10 Essential (primary) hypertension: Secondary | ICD-10-CM | POA: Insufficient documentation

## 2011-12-17 DIAGNOSIS — F209 Schizophrenia, unspecified: Secondary | ICD-10-CM

## 2011-12-17 DIAGNOSIS — F319 Bipolar disorder, unspecified: Secondary | ICD-10-CM | POA: Insufficient documentation

## 2011-12-17 DIAGNOSIS — R45851 Suicidal ideations: Secondary | ICD-10-CM | POA: Insufficient documentation

## 2011-12-17 HISTORY — DX: Bipolar disorder, unspecified: F31.9

## 2011-12-17 LAB — RAPID URINE DRUG SCREEN, HOSP PERFORMED
Benzodiazepines: NOT DETECTED
Cocaine: NOT DETECTED

## 2011-12-17 LAB — URINALYSIS, ROUTINE W REFLEX MICROSCOPIC
Glucose, UA: NEGATIVE mg/dL
Leukocytes, UA: NEGATIVE
Nitrite: NEGATIVE
Specific Gravity, Urine: 1.02 (ref 1.005–1.030)
pH: 5.5 (ref 5.0–8.0)

## 2011-12-17 LAB — CBC
HCT: 28.2 % — ABNORMAL LOW (ref 36.0–46.0)
RBC: 4.1 MIL/uL (ref 3.87–5.11)
RDW: 14.9 % (ref 11.5–15.5)
WBC: 3.9 10*3/uL — ABNORMAL LOW (ref 4.0–10.5)

## 2011-12-17 LAB — BASIC METABOLIC PANEL
BUN: 11 mg/dL (ref 6–23)
CO2: 26 mEq/L (ref 19–32)
Chloride: 102 mEq/L (ref 96–112)
GFR calc Af Amer: >90 mL/min (ref >90)
Potassium: 4.6 mEq/L (ref 3.5–5.1)

## 2011-12-17 LAB — URINE MICROSCOPIC-ADD ON

## 2011-12-17 MED ORDER — LORAZEPAM 1 MG PO TABS: 1.0000 mg | ORAL_TABLET | Freq: Three times a day (TID) | ORAL | Status: DC | PRN

## 2011-12-17 MED ORDER — IBUPROFEN 200 MG PO TABS
200.0000 mg | ORAL_TABLET | Freq: Once | ORAL | Status: AC
  Administered 2011-12-17: 200 mg via ORAL
  Filled 2011-12-17: qty 1

## 2011-12-17 MED ORDER — ZOLPIDEM TARTRATE 5 MG PO TABS: 5.0000 mg | ORAL_TABLET | Freq: Every evening | ORAL | Status: DC | PRN

## 2011-12-17 MED ORDER — IBUPROFEN 200 MG PO TABS: 600.0000 mg | ORAL_TABLET | Freq: Three times a day (TID) | ORAL | Status: DC | PRN

## 2011-12-17 MED ORDER — ALUM & MAG HYDROXIDE-SIMETH 200-200-20 MG/5ML PO SUSP: 30.0000 mL | ORAL | Status: DC | PRN

## 2011-12-17 MED ORDER — ONDANSETRON HCL 8 MG PO TABS: 4.0000 mg | ORAL_TABLET | Freq: Three times a day (TID) | ORAL | Status: DC | PRN

## 2011-12-17 NOTE — ED Provider Notes (Signed)
Medical screening examination/treatment/procedure(s) were performed by non-physician practitioner and as supervising physician I was immediately available for consultation/collaboration.    Bill Yohn L Sumi Lye, MD 12/17/11 2029 

## 2011-12-17 NOTE — ED Notes (Signed)
Report received, assumed care.  

## 2011-12-17 NOTE — ED Notes (Signed)
Charge RN notified need of sitter.

## 2011-12-17 NOTE — ED Notes (Addendum)
Diet tray ordered. Belongings inventoried & placed in locker #1

## 2011-12-17 NOTE — ED Notes (Signed)
States has not been taking any meds x 3-4 weeks because unable to afford them. Reports auditory hallucinations started week ago telling her "of all the different ways to kill herself... To just end it".

## 2011-12-17 NOTE — ED Notes (Addendum)
Patient reports she has not had her medication for 2 weeks.  she is having suicidal thoughts and hearing voices.  Patient has not tried to harm herself up to this point. Patient is to be taking seroquel and welbutrin.  She is calm and cooperative in triage

## 2011-12-17 NOTE — ED Provider Notes (Signed)
History     CSN: [id number]  Arrival date & time 12/17/11  0745   First MD Initiated Contact with Patient 12/17/11 [phone]      Chief Complaint  Patient presents with  . Medical Clearance    (Consider location/radiation/quality/duration/timing/severity/associated sxs/prior treatment) HPI History from patient. 41 year old female who presents for medical clearance. She has a past history of schizophrenia and bipolar disorder. She had been taking Wellbutrin and Seroquel for this, but has been out of her medication for the past several weeks. She is followed by [facility], but states that she has been unable to get to their office for the past several weeks as she does not have transportation; does not have a phone at home to get in touch with them.  She presents at the request of her family, voluntarily. States that she has been having worsening depression symptoms, SI, and auditory hallucinations. She is hearing one voice that talks to her "about the past" and "tells me to hurt myself, makes me think about suicide." She has thought about jumping off a bridge, and cutting herself with knives. States "I went in the bathroom and sat on the ground with the knives, but didn't do anything with them. My girlfriend found me and stopped me." Denies otherwise acting on these ideations. Denies homicidal ideations. Denies visual hallucinations, although states she's had these in the past.  Denies any medical complaint at this time.  Past Medical History  Diagnosis Date  . Hypertension   . Schizophrenia   . Bipolar 1 disorder     History reviewed. No pertinent past surgical history.  No family history on file.  History  Substance Use Topics  . Smoking status: Former Games developer  . Smokeless tobacco: Not on file  . Alcohol Use: No    OB History    Grav Para Term Preterm Abortions TAB SAB Ect Mult Living                  Review of Systems  Constitutional: Negative for fever, activity  change and appetite change.  HENT: Negative for congestion, sore throat, neck pain, neck stiffness and sinus pressure.   Eyes: Negative for photophobia, redness and visual disturbance.  Respiratory: Negative for cough and shortness of breath.   Cardiovascular: Negative for chest pain and palpitations.  Gastrointestinal: Negative for nausea, vomiting, abdominal pain and diarrhea.  Skin: Negative.   Neurological: Negative for dizziness, weakness, numbness and headaches.  Psychiatric/Behavioral: Positive for suicidal ideas and dysphoric mood. Negative for self-injury. The patient is not nervous/anxious.     Allergies  Review of patient's allergies indicates no known allergies.  Home Medications   Current Outpatient Rx  Name Route Sig Dispense Refill  . ATENOLOL-CHLORTHALIDONE 50-25 MG PO TABS Oral Take 1 tablet by mouth daily.    . BUPROPION HCL ER (XL) 150 MG PO TB24 Oral Take 150 mg by mouth daily.    Marland Kitchen FLUTICASONE PROPIONATE 50 MCG/ACT NA SUSP Nasal Place 2 sprays into the nose daily. 16 g 0  . OMEPRAZOLE 20 MG PO CPDR Oral Take 1 capsule (20 mg total) by mouth daily. 30 capsule 0  . QUETIAPINE FUMARATE 50 MG PO TABS Oral Take 50 mg by mouth at bedtime.      BP 160/103  Pulse 92  Temp(Src) 98.1 F (36.7 C) (Oral)  Resp 22  SpO2 100%  LMP 11/18/2011  Physical Exam  Nursing note and vitals reviewed. Constitutional: She is oriented to person, place, and time. She  appears well-developed and well-nourished. No distress.  HENT:  Head: Normocephalic and atraumatic.  Mouth/Throat: Oropharynx is clear and moist. No oropharyngeal exudate.  Eyes: Conjunctivae and EOM are normal. Pupils are equal, round, and reactive to light.  Neck: Normal range of motion. Neck supple.  Cardiovascular: Normal rate, regular rhythm and normal heart sounds.   Pulmonary/Chest: Effort normal and breath sounds normal. She exhibits no tenderness.  Abdominal: Soft. There is no tenderness.  Musculoskeletal:  Normal range of motion.  Neurological: She is alert and oriented to person, place, and time.  Skin: Skin is warm and dry. No rash noted. She is not diaphoretic.  Psychiatric: Her speech is normal. She is withdrawn. She is not actively hallucinating. She exhibits a depressed mood. She expresses suicidal ideation. She expresses suicidal plans.    ED Course  Procedures (including critical care time)  Labs Reviewed  CBC - Abnormal; Notable for the following:    WBC 3.9 (*)    Hemoglobin 9.0 (*)    HCT 28.2 (*)    MCV 68.8 (*)    MCH 22.0 (*)    Platelets 410 (*)    All other components within normal limits  BASIC METABOLIC PANEL - Abnormal; Notable for the following:    Glucose, Bld 104 (*)    GFR calc non Af Amer 88 (*)    All other components within normal limits  URINALYSIS, ROUTINE W REFLEX MICROSCOPIC - Abnormal; Notable for the following:    Hgb urine dipstick MODERATE (*)    All other components within normal limits  URINE RAPID DRUG SCREEN (HOSP PERFORMED)  ETHANOL  POCT PREGNANCY, URINE  URINE MICROSCOPIC-ADD ON   Anemia which is unchanged from previous CBC in July 2012.  No results found.   No diagnosis found.    MDM  Patient is medically clear for evaluation. Calm, cooperative but with flat affect. I've talked with ACT counselor; she'll come see the patient. Pt to be moved to [facility] yellow side; holding orders placed.  Grant Fontana, [place] 12/17/11 1017  Telepsych consult was called. Psychiatrist feels she is stable for dc to outpatient treatment at this time.  Grant Fontana, [place] 12/17/11 1152

## 2011-12-17 NOTE — ED Notes (Signed)
Attempting to get pt assistance with meds. Calls placed to social work & care management
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
## 2011-12-17 NOTE — Discharge Instructions (Signed)
It is very important that you follow up with your mental health providers as instructed by the psychiatrist. Please return to the emergency department if your symptoms worsen or with any other concerns.  RESOURCE GUIDE  Dental Problems  Patients with Medicaid: [facility] [phone]                                           [phone] [address] Phone:  [phone]                                                  Phone:  [phone]  If unable to pay or uninsured, contact:  [facility] or [facility]. to become qualified for the adult dental clinic.  Chronic Pain Problems Contact [facility]  [phone] Patients need to be referred by their primary care doctor.  Insufficient Money for Medicine Contact United Way:  call "211" or [facility] [phone].  No Primary Care Doctor Call [facility]  [phone] Other agencies that provide inexpensive medical care    [facility]  [phone]    [facility]  [phone]    [facility]  [phone]    [facility]  [phone]    [facility]  4303847037    [facility]  [phone]  Psychological Services [facility]  [phone] [facility] Services  [phone] [facility]   [phone] (emergency services [phone])  Substance Abuse Resources [facility] and Drug Services  [phone] [facility] [phone] [facility] [place] [phone] Floydene Flock [phone] Residential & Outpatient Substance Abuse Program  [phone]  Abuse/Neglect [facility] Child Abuse Hotline [phone] [facility] Child Abuse Hotline [phone] (After Hours)  Emergency Shelter [facility] [phone]  Maternity Homes Room at the [place] [facility] [place] [phone] [facility] [phone]  MRSA Hotline #:    (601)407-5902    [facility] Resources  [facility]     [employer]                          [facility] Dept. [address] [place]                       [address]      [address]  [place]                                                Cristobal Goldmann Phone:  [phone]  Phone:  [phone]                 Phone:  [phone]  [place] [facility] Phone:  [phone]  [place] Child Abuse Hotline [phone]) 342-1394 [phone] (After Hours)   

## 2011-12-17 NOTE — ED Notes (Signed)
ACT Team at bedside evaluating pt. 

## 2011-12-17 NOTE — ED Notes (Signed)
Sitter arrived to bedside.  

## 2011-12-17 NOTE — ED Notes (Signed)
CSW met with Pt who reported multiple needs for assistance outside of the hospital. Pt sees therapist every 2 wks at [facility]. CSW called [facility] and asked for Pt to be evaluated for increased level of care called [facility]. Appt set up for this Monday at 2pm, Pt given bus pass to get to appt.  Pt will meet with therapist this Saturday as well.  CSW provided Pt with application for Medicaid transportation to assist with transportation to future appts.  CSW gave pt $1.10 to assist with obtaining medication at pharmacy after leaving the ED. Pt states that she will go from here to pharmacy, CSW gave Pt bus pass to assist with this plan.  Pt is appreciative of CSW and staff support during her stay in the ED.   Frederico Hamman, LCSW [phone]

## 2011-12-17 NOTE — ED Notes (Signed)
Delice Bison, Child psychotherapist at bedside. Assistance with meds, transportation & follow up appt given.

## 2011-12-17 NOTE — BH Assessment (Signed)
Assessment Note  Update:  Pt received telepsych ordered by EDP Beaton.  Telepsychiatrist recommended pt be discharged home to current provider (as she has appt scheduled in two weeks).  EDP Radford Pax also ordered Social Work consult for assistance with transportation and medications.  CSW providing these and pt to be discharged home.  Updated assessment disposition, completed assessment notification and faxed to [facility] to log.     Disposition:  Disposition Disposition of Patient: Referred to;Outpatient treatment Type of outpatient treatment: Adult Other disposition(s): To current provider Patient referred to: Other (Comment) (Referred to Social Work )  On Engineer, petroleum by:   Reviewed with Physician:  Earnestine Mealing 12/17/2011 2:39 PM

## 2012-03-09 ENCOUNTER — Other Ambulatory Visit (HOSPITAL_COMMUNITY): Payer: Self-pay | Admitting: Family Medicine

## 2012-03-09 ENCOUNTER — Ambulatory Visit (HOSPITAL_COMMUNITY)
Admission: RE | Admit: 2012-03-09 | Discharge: 2012-03-09 | Disposition: A | Discharge status: Home / Self Care | Payer: Medicare Other | Source: Ambulatory Visit | Attending: Family Medicine | Admitting: Family Medicine

## 2012-03-09 DIAGNOSIS — H209 Unspecified iridocyclitis: Secondary | ICD-10-CM

## 2012-03-12 ENCOUNTER — Encounter (HOSPITAL_COMMUNITY): Payer: Self-pay | Admitting: Cardiology

## 2012-03-12 ENCOUNTER — Emergency Department (HOSPITAL_COMMUNITY)
Admission: EM | Admit: 2012-03-12 | Discharge: 2012-03-12 | Disposition: A | Discharge status: Home / Self Care | Payer: Medicare Other | Attending: Emergency Medicine | Admitting: Emergency Medicine

## 2012-03-12 DIAGNOSIS — F209 Schizophrenia, unspecified: Secondary | ICD-10-CM | POA: Insufficient documentation

## 2012-03-12 DIAGNOSIS — Z87891 Personal history of nicotine dependence: Secondary | ICD-10-CM | POA: Insufficient documentation

## 2012-03-12 DIAGNOSIS — W57XXXA Bitten or stung by nonvenomous insect and other nonvenomous arthropods, initial encounter: Secondary | ICD-10-CM

## 2012-03-12 DIAGNOSIS — F319 Bipolar disorder, unspecified: Secondary | ICD-10-CM | POA: Insufficient documentation

## 2012-03-12 DIAGNOSIS — L089 Local infection of the skin and subcutaneous tissue, unspecified: Secondary | ICD-10-CM | POA: Insufficient documentation

## 2012-03-12 MED ORDER — DOXYCYCLINE HYCLATE 100 MG PO TABS
100.0000 mg | ORAL_TABLET | Freq: Once | ORAL | Status: AC
  Administered 2012-03-12: 100 mg via ORAL
  Filled 2012-03-12: qty 1

## 2012-03-12 MED ORDER — HYDROCODONE-ACETAMINOPHEN 5-325 MG PO TABS
1.0000 | ORAL_TABLET | Freq: Once | ORAL | Status: AC
  Administered 2012-03-12: 1 via ORAL
  Filled 2012-03-12: qty 1

## 2012-03-12 MED ORDER — NAPROXEN 500 MG PO TABS: 500.0000 mg | ORAL_TABLET | Freq: Two times a day (BID) | ORAL | Status: DC

## 2012-03-12 MED ORDER — DIPHENHYDRAMINE HCL 25 MG PO TABS: 50.0000 mg | ORAL_TABLET | Freq: Three times a day (TID) | ORAL | Status: DC | PRN

## 2012-03-12 MED ORDER — DOXYCYCLINE HYCLATE 100 MG PO TABS: 100.0000 mg | ORAL_TABLET | Freq: Once | ORAL | Status: AC

## 2012-03-12 NOTE — ED Notes (Signed)
Pt reports that she noticed to small spider bites in different areas of her arm about 3 days ago. Now has redness and swelling to those areas. Denies any fevers.

## 2012-03-12 NOTE — ED Provider Notes (Signed)
History     CSN: [id number]  Arrival date & time 03/12/12  0847   First MD Initiated Contact with Patient 03/12/12 [phone]      Chief Complaint  Patient presents with  . Bites to left arm      HPI Pt presents to the ED with arm pain. Patient states it started 3 days ago. She initially noticed small bites that she attributed to spider bites. Those areas have become increasingly swollen and tender. She denies any fevers., No chest pain shortness or breath. The symptoms increase with palpation. She denies any tick bites and has never had anything similar to this in the past. Past Medical History  Diagnosis Date  . Hypertension   . Schizophrenia   . Bipolar 1 disorder     History reviewed. No pertinent past surgical history.  History reviewed. No pertinent family history.  History  Substance Use Topics  . Smoking status: Former Games developer  . Smokeless tobacco: Not on file  . Alcohol Use: No    OB History    Grav Para Term Preterm Abortions TAB SAB Ect Mult Living                  Review of Systems  All other systems reviewed and are negative.    Allergies  Tylenol  Home Medications   Current Outpatient Rx  Name Route Sig Dispense Refill  . ATENOLOL-CHLORTHALIDONE 50-25 MG PO TABS Oral Take 1 tablet by mouth daily.    . BUPROPION HCL ER (XL) 150 MG PO TB24 Oral Take 150 mg by mouth daily.    Marland Kitchen OMEPRAZOLE 20 MG PO CPDR Oral Take 20 mg by mouth daily as needed. Acid reflux    . QUETIAPINE FUMARATE 50 MG PO TABS Oral Take 50 mg by mouth at bedtime.      BP 128/87  Pulse 99  Temp 97 F (36.1 C) (Oral)  Resp 20  SpO2 99%  LMP 02/27/2012  Physical Exam  Nursing note and vitals reviewed. Constitutional: She appears well-developed and well-nourished. No distress.  HENT:  Head: Normocephalic and atraumatic.  Right Ear: External ear normal.  Left Ear: External ear normal.  Eyes: Conjunctivae are normal. Right eye exhibits no discharge. Left eye exhibits no  discharge. No scleral icterus.  Neck: Neck supple. No tracheal deviation present.  Cardiovascular: Normal rate and regular rhythm.   Pulmonary/Chest: Effort normal and breath sounds normal. No stridor. No respiratory distress. She has no wheezes.  Musculoskeletal: She exhibits edema and tenderness.       Left upper arm and left elbow with  circular indurated and erythematous areas approximately 10-12 cm, no fluctuance, no pustules, normal distal neurovascular intact  Neurological: She is alert. Cranial nerve deficit: no gross deficits.  Skin: Skin is warm and dry. No rash noted.  Psychiatric: She has a normal mood and affect.    ED Course  Procedures (including critical care time)  Labs Reviewed - No data to display No results found.    MDM  Symptoms could be related to inflammation associated with some type of insect bite. It is possible the patient could have an early cellulitis as well.  Start her on oral antibiotics and have her take an antihistamine. She'll given medications for pain.      Celene Kras, MD 03/12/12 [phone]

## 2013-02-15 ENCOUNTER — Emergency Department (HOSPITAL_COMMUNITY)
Admission: EM | Admit: 2013-02-15 | Discharge: 2013-02-15 | Disposition: A | Discharge status: Home / Self Care | Payer: Medicare Other | Attending: Emergency Medicine | Admitting: Emergency Medicine

## 2013-02-15 ENCOUNTER — Encounter (HOSPITAL_COMMUNITY): Payer: Self-pay | Admitting: Emergency Medicine

## 2013-02-15 DIAGNOSIS — Y9389 Activity, other specified: Secondary | ICD-10-CM | POA: Insufficient documentation

## 2013-02-15 DIAGNOSIS — R229 Localized swelling, mass and lump, unspecified: Secondary | ICD-10-CM | POA: Insufficient documentation

## 2013-02-15 DIAGNOSIS — I10 Essential (primary) hypertension: Secondary | ICD-10-CM

## 2013-02-15 DIAGNOSIS — Z87891 Personal history of nicotine dependence: Secondary | ICD-10-CM | POA: Insufficient documentation

## 2013-02-15 DIAGNOSIS — R21 Rash and other nonspecific skin eruption: Secondary | ICD-10-CM | POA: Insufficient documentation

## 2013-02-15 DIAGNOSIS — F319 Bipolar disorder, unspecified: Secondary | ICD-10-CM | POA: Insufficient documentation

## 2013-02-15 DIAGNOSIS — Y929 Unspecified place or not applicable: Secondary | ICD-10-CM | POA: Insufficient documentation

## 2013-02-15 DIAGNOSIS — T63461A Toxic effect of venom of wasps, accidental (unintentional), initial encounter: Secondary | ICD-10-CM | POA: Insufficient documentation

## 2013-02-15 DIAGNOSIS — F209 Schizophrenia, unspecified: Secondary | ICD-10-CM | POA: Insufficient documentation

## 2013-02-15 MED ORDER — HYDROCHLOROTHIAZIDE 25 MG PO TABS: 25.0000 mg | ORAL_TABLET | Freq: Every day | ORAL | Status: DC

## 2013-02-15 MED ORDER — DIPHENHYDRAMINE HCL 25 MG PO CAPS: 25.0000 mg | ORAL_CAPSULE | Freq: Four times a day (QID) | ORAL | Status: DC | PRN

## 2013-02-15 MED ORDER — TRAMADOL HCL 50 MG PO TABS: 50.0000 mg | ORAL_TABLET | Freq: Four times a day (QID) | ORAL | Status: DC | PRN

## 2013-02-15 MED ORDER — HYDROCHLOROTHIAZIDE 12.5 MG PO CAPS
25.0000 mg | ORAL_CAPSULE | Freq: Once | ORAL | Status: AC
  Administered 2013-02-15: 25 mg via ORAL
  Filled 2013-02-15 (×2): qty 2

## 2013-02-15 MED ORDER — IBUPROFEN 800 MG PO TABS
800.0000 mg | ORAL_TABLET | Freq: Once | ORAL | Status: AC
  Administered 2013-02-15: 800 mg via ORAL
  Filled 2013-02-15: qty 1

## 2013-02-15 NOTE — ED Provider Notes (Signed)
History     CSN: [id number]  Arrival date & time 02/15/13  1217   First MD Initiated Contact with Patient 02/15/13 1225      Chief Complaint  Patient presents with  . Insect Bite    (Consider location/radiation/quality/duration/timing/severity/associated sxs/prior treatment) HPI KAREL MOWERS is a 42 y.o.female with a PMH of schizophrenia, hypertension and bipolar type 1 disorder presents to the ER with complaints of wasp sting to her left eyelid and needing a refill or her BP medication. She was stung today while outside and said it hurt so bad she came to the ER. She admits that the pain has resolved almost completely. She has minor swelling to the eyelid and denies pain or decreased visual acuity to her globe. She denies having allergy to stings. She has hx of hypertension and is from [place] and ran out of her medication. Says she is moving back to [place] soon. She denies CP, SOB, throat tightness, wheezing, weakness, confusion, lethargy.  Past Medical History  Diagnosis Date  . Hypertension   . Schizophrenia   . Bipolar 1 disorder     History reviewed. No pertinent past surgical history.  History reviewed. No pertinent family history.  History  Substance Use Topics  . Smoking status: Former Games developer  . Smokeless tobacco: Not on file  . Alcohol Use: No    OB History   Grav Para Term Preterm Abortions TAB SAB Ect Mult Living                  Review of Systems  Skin: Positive for rash.  All other systems reviewed and are negative.    Allergies  Tylenol  Home Medications   Current Outpatient Rx  Name  Route  Sig  Dispense  Refill  . diphenhydrAMINE (BENADRYL) 25 mg capsule   Oral   Take 1 capsule (25 mg total) by mouth every 6 (six) hours as needed for itching.   30 capsule   0   . hydrochlorothiazide (HYDRODIURIL) 25 MG tablet   Oral   Take 1 tablet (25 mg total) by mouth daily.   30 tablet   0   . traMADol (ULTRAM) 50 MG tablet   Oral   Take 1  tablet (50 mg total) by mouth every 6 (six) hours as needed for pain.   15 tablet   0     BP 161/102  Pulse 88  Resp 20  Physical Exam  Nursing note and vitals reviewed. Constitutional: She appears well-developed and well-nourished. No distress.  HENT:  Head: Normocephalic and atraumatic.  Eyes: Conjunctivae and EOM are normal. Pupils are equal, round, and reactive to light. No foreign bodies found. Left eye exhibits no discharge. No foreign body present in the left eye. Left conjunctiva is not injected. Left conjunctiva has no hemorrhage. Left eye exhibits normal extraocular motion and no nystagmus.  Patient has mild swelling to her upper eyelid around site of sting. Stinger appears to have not been retained.  Neck: Normal range of motion. Neck supple.  Cardiovascular: Normal rate and regular rhythm.   Pulmonary/Chest: Effort normal.  Abdominal: Soft.  Neurological: She is alert.  Skin: Skin is warm and dry.    ED Course  Procedures (including critical care time)  Labs Reviewed - No data to display No results found.   1. Hypertension   2. Wasp sting, initial encounter       MDM  No evidence of a systemic reaction. Local reaction is minor. Will  start on Benadryl for swelling, itching pain. As well as ultram. Refilled BP medication.  42 y.o.Kendel P Moldovan's evaluation in the Emergency Department is complete. It has been determined that no acute conditions requiring further emergency intervention are present at this time. The patient/guardian have been advised of the diagnosis and plan. We have discussed signs and symptoms that warrant return to the ED, such as changes or worsening in symptoms.  Vital signs are stable at discharge. Filed Vitals:   02/15/13 1233  BP: 161/102  Pulse:   Resp:     Patient/guardian has voiced understanding and agreed to follow-up with the PCP or specialist.         Dorthula Matas, PA-C 02/15/13 1316

## 2013-02-15 NOTE — ED Notes (Addendum)
Pt was stung buy a wasp and has swelling to the lt eye, denies having an allergy to wasp. Pt has a hx of hypertension but is not taking her medication.

## 2013-02-15 NOTE — ED Provider Notes (Signed)
Medical screening examination/treatment/procedure(s) were performed by non-physician practitioner and as supervising physician I was immediately available for consultation/collaboration.   Liliyana Thobe, MD 02/15/13 1517 

## 2015-05-08 ENCOUNTER — Emergency Department (INDEPENDENT_AMBULATORY_CARE_PROVIDER_SITE_OTHER)
Admission: EM | Admit: 2015-05-08 | Discharge: 2015-05-08 | Disposition: A | Discharge status: Home / Self Care | Payer: Medicare Other | Source: Home / Self Care | Attending: Family Medicine | Admitting: Family Medicine

## 2015-05-08 ENCOUNTER — Encounter (HOSPITAL_COMMUNITY): Payer: Self-pay | Admitting: *Deleted

## 2015-05-08 DIAGNOSIS — I10 Essential (primary) hypertension: Secondary | ICD-10-CM

## 2015-05-08 LAB — POCT I-STAT, CHEM 8
BUN: 11 mg/dL (ref 6–20)
CREATININE: 0.8 mg/dL (ref 0.44–1.00)
Calcium, Ion: 1.25 mmol/L — ABNORMAL HIGH (ref 1.12–1.23)
Chloride: 99 mmol/L — ABNORMAL LOW (ref 101–111)
Glucose, Bld: 91 mg/dL (ref 65–99)
HEMATOCRIT: 34 % — AB (ref 36.0–46.0)
Hemoglobin: 11.6 g/dL — ABNORMAL LOW (ref 12.0–15.0)
POTASSIUM: 3.7 mmol/L (ref 3.5–5.1)
Sodium: 137 mmol/L (ref 135–145)
TCO2: 25 mmol/L (ref 0–100)

## 2015-05-08 MED ORDER — LISINOPRIL-HYDROCHLOROTHIAZIDE 10-12.5 MG PO TABS: 1.0000 | ORAL_TABLET | Freq: Every day | ORAL | Status: DC

## 2015-05-08 NOTE — ED Notes (Signed)
Pt reports   Went to have  A  Tooth  Pulled  And  Was  Told  Her bp  Was  Elevated         She  Reports     Non   complient  With meds    Pt  Reports  Has  Been taking  Motrin  For  The  Pain

## 2015-05-08 NOTE — ED Provider Notes (Signed)
CSN: [id number]     Arrival date & time 05/08/15  1404 History   First MD Initiated Contact with Angela Conner 05/08/15 1511     Chief Complaint  Angela Conner presents with  . Dizziness   (Consider location/radiation/quality/duration/timing/severity/associated sxs/prior Treatment) Angela Conner is a 44 y.o. female presenting with general illness. The history is provided by the Angela Conner.  Illness Severity:  Moderate Onset quality:  Gradual Chronicity:  Chronic Context:  Told by dentist that bp elevated today, needs rx before further care. not taking hctz as prescribed. Associated symptoms: no abdominal pain, no chest pain, no fever and no headaches     Past Medical History  Diagnosis Date  . Hypertension   . Schizophrenia   . Bipolar 1 disorder    History reviewed. No pertinent past surgical history. History reviewed. No pertinent family history. Social History  Substance Use Topics  . Smoking status: Former Research scientist (life sciences)  . Smokeless tobacco: None  . Alcohol Use: No   OB History    No data available     Review of Systems  Constitutional: Negative for fever.  Respiratory: Negative.   Cardiovascular: Negative.  Negative for chest pain.  Gastrointestinal: Negative for abdominal pain.  Neurological: Negative for dizziness, seizures and headaches.  All other systems reviewed and are negative.   Allergies  Tylenol  Home Medications   Prior to Admission medications   Medication Sig Start Date End Date Taking? Authorizing Provider  diphenhydrAMINE (BENADRYL) 25 mg capsule Take 1 capsule (25 mg total) by mouth every 6 (six) hours as needed for itching. 02/15/13   Delos Haring, PA-C  hydrochlorothiazide (HYDRODIURIL) 25 MG tablet Take 1 tablet (25 mg total) by mouth daily. 02/15/13   Tiffany Carlota Raspberry, PA-C  lisinopril-hydrochlorothiazide (PRINZIDE,ZESTORETIC) 10-12.5 MG per tablet Take 1 tablet by mouth daily. For bp. 05/08/15   Billy Fischer, MD  traMADol (ULTRAM) 50 MG tablet Take 1 tablet (50 mg  total) by mouth every 6 (six) hours as needed for pain. 02/15/13   Delos Haring, PA-C   Meds Ordered and Administered this Visit  Medications - No data to display  BP 146/105 mmHg  Pulse 86  Temp(Src) 98.2 F (36.8 C) (Oral)  Resp 16  SpO2 98%  LMP 04/27/2015 No data found.   Physical Exam  Constitutional: She is oriented to person, place, and time. She appears well-developed and well-nourished.  HENT:  Right Ear: External ear normal.  Left Ear: External ear normal.  Eyes: Conjunctivae and EOM are normal. Pupils are equal, round, and reactive to light.  Neck: Normal range of motion. Neck supple.  Cardiovascular: Normal rate, regular rhythm, normal heart sounds and intact distal pulses.   Pulmonary/Chest: Effort normal and breath sounds normal.  Lymphadenopathy:    She has no cervical adenopathy.  Neurological: She is alert and oriented to person, place, and time.  Skin: Skin is warm and dry.  Nursing note and vitals reviewed.   ED Course  Procedures (including critical care time)  Labs Review Labs Reviewed  POCT I-STAT, CHEM 8 - Abnormal; Notable for the following:    Chloride 99 (*)    Calcium, Ion 1.25 (*)    Hemoglobin 11.6 (*)    HCT 34.0 (*)    All other components within normal limits   i-stat wnl ex h/h sl low. Imaging Review No results found.   Visual Acuity Review  Right Eye Distance:   Left Eye Distance:   Bilateral Distance:    Right Eye Near:   Left  Eye Near:    Bilateral Near:         MDM   1. Essential hypertension     rx for lisinopril hct given for bp.   Billy Fischer, MD 05/08/15 1626

## 2015-05-08 NOTE — Discharge Instructions (Signed)
See your doctor for recheck of bp and refills of medicine.

## 2015-05-22 ENCOUNTER — Encounter (HOSPITAL_COMMUNITY): Payer: Self-pay | Admitting: Family Medicine

## 2015-05-22 ENCOUNTER — Emergency Department (HOSPITAL_COMMUNITY)
Admission: EM | Admit: 2015-05-22 | Discharge: 2015-05-22 | Disposition: A | Discharge status: Home / Self Care | Payer: Medicare Other | Attending: Emergency Medicine | Admitting: Emergency Medicine

## 2015-05-22 DIAGNOSIS — F319 Bipolar disorder, unspecified: Secondary | ICD-10-CM | POA: Diagnosis not present

## 2015-05-22 DIAGNOSIS — K029 Dental caries, unspecified: Secondary | ICD-10-CM | POA: Insufficient documentation

## 2015-05-22 DIAGNOSIS — K088 Other specified disorders of teeth and supporting structures: Secondary | ICD-10-CM | POA: Diagnosis present

## 2015-05-22 DIAGNOSIS — Z87891 Personal history of nicotine dependence: Secondary | ICD-10-CM | POA: Diagnosis not present

## 2015-05-22 DIAGNOSIS — I10 Essential (primary) hypertension: Secondary | ICD-10-CM | POA: Insufficient documentation

## 2015-05-22 DIAGNOSIS — Z792 Long term (current) use of antibiotics: Secondary | ICD-10-CM | POA: Insufficient documentation

## 2015-05-22 DIAGNOSIS — K047 Periapical abscess without sinus: Secondary | ICD-10-CM

## 2015-05-22 MED ORDER — TRAMADOL HCL 50 MG PO TABS: 50.0000 mg | ORAL_TABLET | Freq: Four times a day (QID) | ORAL | Status: DC | PRN

## 2015-05-22 MED ORDER — CLINDAMYCIN HCL 150 MG PO CAPS: 300.0000 mg | ORAL_CAPSULE | Freq: Three times a day (TID) | ORAL | Status: DC

## 2015-05-22 NOTE — Discharge Instructions (Signed)
You can continue to take ibuprofen in addition to the medication I give you today. Follow up with your dentist as soon as possible.

## 2015-05-22 NOTE — ED Provider Notes (Signed)
CSN: [id number]     Arrival date & time 05/22/15  1222 History  This chart was scribed for non-physician practitioner Debroah Baller, NP working with Blanchie Dessert, MD by Hilda Lias, ED Scribe. This patient was seen in room TR10C/TR10C and the patient's care was started at 3:59 PM.  Chief Complaint  Patient presents with  . Dental Pain      The history is provided by the patient. No language interpreter was used.     HPI Comments: Angela Conner is a 44 y.o. female with HTN who presents to the Emergency Department complaining of constant, worsening, throbbing 10/10 left mandibular molar dental pain with associated nausea and vomiting that has been present for the past couple of days. Pt states her dental pain began about 2 weeks ago, and saw a dentist to have it extracted, but was told the dentist would not treat her because her blood pressure was so high. She has had follow up and her BP medication has been changed but she has not called the dentist back.  Pt reports she has been taking Amoxicillin but only has a few left and advil OTC for her pain with little relief. Pt states she does not know why she has been vomiting recently and states it could be from the constant pain.    Past Medical History  Diagnosis Date  . Hypertension   . Schizophrenia   . Bipolar 1 disorder    History reviewed. No pertinent past surgical history. History reviewed. No pertinent family history. Social History  Substance Use Topics  . Smoking status: Former Research scientist (life sciences)  . Smokeless tobacco: None  . Alcohol Use: No   OB History    No data available     Review of Systems Negative except as stated in HPI  Allergies  Tylenol  Home Medications   Prior to Admission medications   Medication Sig Start Date End Date Taking? Authorizing Provider  clindamycin (CLEOCIN) 150 MG capsule Take 2 capsules (300 mg total) by mouth 3 (three) times daily. 05/22/15   Hope Bunnie Pion, NP  diphenhydrAMINE (BENADRYL) 25 mg  capsule Take 1 capsule (25 mg total) by mouth every 6 (six) hours as needed for itching. 02/15/13   Delos Haring, PA-C  hydrochlorothiazide (HYDRODIURIL) 25 MG tablet Take 1 tablet (25 mg total) by mouth daily. 02/15/13   Tiffany Carlota Raspberry, PA-C  lisinopril-hydrochlorothiazide (PRINZIDE,ZESTORETIC) 10-12.5 MG per tablet Take 1 tablet by mouth daily. For bp. 05/08/15   Billy Fischer, MD  traMADol (ULTRAM) 50 MG tablet Take 1 tablet (50 mg total) by mouth every 6 (six) hours as needed. 05/22/15   Hope Bunnie Pion, NP   BP 149/94 mmHg  Pulse 95  Temp(Src) 98.4 F (36.9 C) (Oral)  Resp 20  SpO2 100%  LMP 04/27/2015 Physical Exam  Constitutional: She is oriented to person, place, and time. She appears well-developed and well-nourished.  Non-toxic appearance. No distress.  HENT:  Head: Normocephalic and atraumatic.  Right Ear: Tympanic membrane normal.  Left Ear: Tympanic membrane normal.  Nose: Nose normal.  Mouth/Throat: Uvula is midline, oropharynx is clear and moist and mucous membranes are normal.  2nd molar decayed and tender on palpation Swelling around the gum  Eyes: Conjunctivae, EOM and lids are normal. Pupils are equal, round, and reactive to light.  Neck: Normal range of motion. Neck supple. No thyroid mass present.  Cardiovascular: Normal rate and regular rhythm.   Pulmonary/Chest: Effort normal and breath sounds normal.  Abdominal: Normal appearance.  Musculoskeletal: Normal range of motion. She exhibits no edema or tenderness.  Lymphadenopathy:    She has no cervical adenopathy.  Neurological: She is alert and oriented to person, place, and time. She has normal strength.  Skin: Skin is warm and dry. No abrasion and no rash noted.  Psychiatric: She has a normal mood and affect. Her speech is normal and behavior is normal.  Nursing note and vitals reviewed.   ED Course  Procedures (including critical care time) Topical lidocaine gel, and decayed area of tooth filled with temporary  filling. Patient reports pain relief.   DIAGNOSTIC STUDIES: Oxygen Saturation is 100% on room air, normal by my interpretation.    COORDINATION OF CARE: 4:05 PM Discussed treatment plan with pt at bedside and pt agreed to plan.   MDM  44 y.o. female with dental pain that has increased over the past few days. Stable for d/c without fever and does not appear toxic. Will treat with Clindamycin and pain medication and she will follow up with her dentist ASAP.   Final diagnoses:  Dental infection   I personally performed the services described in this documentation, which was scribed in my presence. The recorded information has been reviewed and is accurate.    [address] [place], [place] 05/22/15 2152  Blanchie Dessert, MD 05/24/15 0730

## 2015-05-22 NOTE — ED Notes (Addendum)
Pt here for dental pain. sts that she went to the dentist but her BP was elevated. sts then she went to [facility] and they sent her here. sts she has been taking BP meds. Denies pain currently sts she just took amoxicillin and tylenol

## 2017-01-11 ENCOUNTER — Emergency Department (HOSPITAL_COMMUNITY)
Admission: EM | Admit: 2017-01-11 | Discharge: 2017-01-11 | Disposition: A | Discharge status: Home / Self Care | Payer: Medicare Other | Attending: Emergency Medicine | Admitting: Emergency Medicine

## 2017-01-11 ENCOUNTER — Encounter (HOSPITAL_COMMUNITY): Payer: Self-pay | Admitting: Emergency Medicine

## 2017-01-11 ENCOUNTER — Emergency Department (HOSPITAL_COMMUNITY): Payer: Medicare Other

## 2017-01-11 DIAGNOSIS — R51 Headache: Secondary | ICD-10-CM | POA: Diagnosis not present

## 2017-01-11 DIAGNOSIS — Z79899 Other long term (current) drug therapy: Secondary | ICD-10-CM | POA: Diagnosis not present

## 2017-01-11 DIAGNOSIS — R42 Dizziness and giddiness: Secondary | ICD-10-CM | POA: Insufficient documentation

## 2017-01-11 DIAGNOSIS — I10 Essential (primary) hypertension: Secondary | ICD-10-CM | POA: Diagnosis not present

## 2017-01-11 DIAGNOSIS — Z9119 Patient's noncompliance with other medical treatment and regimen: Secondary | ICD-10-CM | POA: Diagnosis not present

## 2017-01-11 DIAGNOSIS — Z87891 Personal history of nicotine dependence: Secondary | ICD-10-CM | POA: Diagnosis not present

## 2017-01-11 DIAGNOSIS — R030 Elevated blood-pressure reading, without diagnosis of hypertension: Secondary | ICD-10-CM | POA: Diagnosis not present

## 2017-01-11 DIAGNOSIS — Z91199 Patient's noncompliance with other medical treatment and regimen due to unspecified reason: Secondary | ICD-10-CM

## 2017-01-11 HISTORY — DX: Pure hypercholesterolemia, unspecified: E78.00

## 2017-01-11 MED ORDER — LISINOPRIL-HYDROCHLOROTHIAZIDE 10-12.5 MG PO TABS: 1.0000 | ORAL_TABLET | Freq: Every day | ORAL | 1 refills | Status: DC

## 2017-01-11 MED ORDER — HYDROCHLOROTHIAZIDE 12.5 MG PO CAPS
12.5000 mg | ORAL_CAPSULE | Freq: Once | ORAL | Status: AC
  Administered 2017-01-11: 12.5 mg via ORAL
  Filled 2017-01-11: qty 1

## 2017-01-11 MED ORDER — LISINOPRIL 10 MG PO TABS
10.0000 mg | ORAL_TABLET | Freq: Once | ORAL | Status: AC
  Administered 2017-01-11: 10 mg via ORAL
  Filled 2017-01-11: qty 1

## 2017-01-11 NOTE — ED Provider Notes (Signed)
[facility] DEPT Provider Note   CSN: [id number] Arrival date & time: 01/11/17  1253     History   Chief Complaint Chief Complaint  Patient presents with  . Headache  . Dizziness    HPI Angela Conner is a 46 y.o. female with history of untreated hypertension due to medical noncompliance presents to ED with headache, dizziness and intermittent blurred vision since this AM. Her blood pressure was checked at a bank and was elevated, she was advised to the emergency department. Patient has not taken blood pressure medicines for the past 7 years. She denies chest pain, shortness of breath, abdominal pain or back pain, numbness or weakness to extremities. Does not have a PCP. She notes she was once taking a medicine with HCTZ.  HPI  Past Medical History:  Diagnosis Date  . Bipolar 1 disorder ([place])   . Hypercholesteremia   . Hypertension   . Schizophrenia ([place])     There are no active problems to display for this patient.   History reviewed. No pertinent surgical history.  OB History    No data available       Home Medications    Prior to Admission medications   Medication Sig Start Date End Date Taking? Authorizing Provider  clindamycin (CLEOCIN) 150 MG capsule Take 2 capsules (300 mg total) by mouth 3 (three) times daily. Patient not taking: Reported on 01/11/2017 05/22/15   Ashley Murrain, NP  diphenhydrAMINE (BENADRYL) 25 mg capsule Take 1 capsule (25 mg total) by mouth every 6 (six) hours as needed for itching. Patient not taking: Reported on 01/11/2017 02/15/13   Delos Haring, PA-C  hydrochlorothiazide (HYDRODIURIL) 25 MG tablet Take 1 tablet (25 mg total) by mouth daily. Patient not taking: Reported on 01/11/2017 02/15/13   Delos Haring, PA-C  lisinopril-hydrochlorothiazide (PRINZIDE,ZESTORETIC) 10-12.5 MG tablet Take 1 tablet by mouth daily. For bp. 01/11/17   Kinnie Feil, PA-C  traMADol (ULTRAM) 50 MG tablet Take 1 tablet (50 mg total) by mouth every 6  (six) hours as needed. Patient not taking: Reported on 01/11/2017 05/22/15   Ashley Murrain, NP    Family History History reviewed. No pertinent family history.  Social History Social History  Substance Use Topics  . Smoking status: Former Research scientist (life sciences)  . Smokeless tobacco: Never Used  . Alcohol use No     Allergies   Tylenol [acetaminophen]   Review of Systems Review of Systems  Constitutional: Negative for chills and fever.  Eyes: Positive for visual disturbance.  Respiratory: Negative for cough, chest tightness and shortness of breath.   Cardiovascular: Negative for chest pain, palpitations and leg swelling.  Gastrointestinal: Negative for abdominal pain, constipation, diarrhea, nausea and vomiting.  Genitourinary: Negative for difficulty urinating, flank pain and hematuria.  Musculoskeletal: Negative for back pain.  Skin: Negative for rash.  Neurological: Positive for dizziness and headaches. Negative for seizures, syncope, weakness, light-headedness and numbness.  Hematological: Does not bruise/bleed easily.  Psychiatric/Behavioral: Negative.      Physical Exam Updated Vital Signs BP (!) 167/102 (BP Location: Left Arm)   Pulse 83   Temp 98.1 F (36.7 C) (Oral)   Resp 17   SpO2 100%   Physical Exam  Constitutional: She is oriented to person, place, and time. She appears well-developed and well-nourished.  HENT:  Head: Normocephalic and atraumatic.  Nose: Nose normal.  Mouth/Throat: Oropharynx is clear and moist. No oropharyngeal exudate.  Eyes: Conjunctivae and EOM are normal. Pupils are equal, round, and reactive to  light.  Neck: Normal range of motion. Neck supple. No JVD present.  Cardiovascular: Normal rate, regular rhythm, normal heart sounds and intact distal pulses.   No murmur heard. Pulmonary/Chest: Effort normal and breath sounds normal. No respiratory distress. She has no wheezes. She has no rales. She exhibits no tenderness.  Abdominal: Soft. Bowel  sounds are normal. She exhibits no distension and no mass. There is no tenderness.  Musculoskeletal: Normal range of motion. She exhibits no deformity.  Lymphadenopathy:    She has no cervical adenopathy.  Neurological: She is alert and oriented to person, place, and time. No sensory deficit.  Pt is alert and oriented.   Speech and phonation normal.   Thought process coherent.   Strength 5/5 in upper and lower extremities.   Sensation to light touch intact in upper and lower extremities.  No trunk instability or sway.  No leg drift.  Intact finger to nose test. CN I not tested CN II full visual fields  CN III, IV, VI PEERL and EOM intact CN V light touch intact in all 3 divisions of trigeminal nerve CN VII facial nerve movements intact, symmetric CN VIII hearing intact to finger rub CN IX, X no uvula deviation, symmetric soft palate rise CN XI 5/5 SCM and trapezius strength  CN XII Tongue midline with symmetric L/R movement  Skin: Skin is warm and dry. Capillary refill takes less than 2 seconds.  Psychiatric: She has a normal mood and affect. Her behavior is normal. Judgment and thought content normal.  Nursing note and vitals reviewed.    ED Treatments / Results  Labs (all labs ordered are listed, but only abnormal results are displayed) Labs Reviewed - No data to display  EKG  EKG Interpretation None       Radiology Ct Head Wo Contrast  Result Date: 01/11/2017 CLINICAL DATA:  Mild bilateral temporal headache and dizziness. EXAM: CT HEAD WITHOUT CONTRAST TECHNIQUE: Contiguous axial images were obtained from the base of the skull through the vertex without intravenous contrast. COMPARISON:  None. FINDINGS: Brain: Probable calcified meningiomas in the base of the skull on the left on series 3, image 7 and adjacent to the anterior left temporal lobe on series 3, image 6. No subdural, epidural, or subarachnoid hemorrhage. Ventricles and sulci are normal. Cerebellum, brainstem,  and basal cisterns are normal. No mass, mass effect, or midline shift. No acute cortical ischemia or infarct Vascular: No hyperdense vessel or unexpected calcification. Skull: Normal. Negative for fracture or focal lesion. Sinuses/Orbits: No acute finding. Other: None. IMPRESSION: 1. No acute intracranial abnormality. No cause for the patient's symptoms identified. Electronically Signed   By: Dorise Bullion III M.D   On: 01/11/2017 14:51    Procedures Procedures (including critical care time)  Medications Ordered in ED Medications  hydrochlorothiazide (MICROZIDE) capsule 12.5 mg (12.5 mg Oral Given 01/11/17 1443)  lisinopril (PRINIVIL,ZESTRIL) tablet 10 mg (10 mg Oral Given 01/11/17 1444)     Initial Impression / Assessment and Plan / ED Course  I have reviewed the triage vital signs and the nursing notes.  Pertinent labs & imaging results that were available during my care of the patient were reviewed by me and considered in my medical decision making (see chart for details).  Clinical Course as of Jan 12 2012  Sat Jan 11, 2017  1455 IMPRESSION: 1. No acute intracranial abnormality. No cause for the patient's symptoms identified. CT Head Wo Contrast [CG]    Clinical Course User Index [CG] Donney Rankins,  Orson Gear, PA-C    46 year old female with history of untreated hypertension due to medical noncompliance presents to the ED with gradual worsening, gradual in onset mild headache associated with blurred vision and dizziness since this morning. Patient reports elevated blood pressure reading. Has not been taking hypertensive medications for the past 7 years. Denies chest pain, shortness of breath, cough, orthopnea, abdominal pain or back pain, numbness or weakness to extremities. On exam blood pressure is elevated at 171/103, otherwise vital signs are within normal limits. Patient is nontoxic appearing. Neuro exam normal. No abdominal tenderness or masses. No back pain. Patient declined blood  work as she does not want to be poked by needles. Given history of headache and untreated hypertension I am concerned for hypertensive emergency.  I explained my concern and again offered blood work to check renal function however patient denied again that she does not want to be poked by needles.  I will give patient her home hypertensive medication and will get a CT scan. Patient is agreeable with this plan.  CT scan of head negative. Patient was given antihypertensive medicine in the ED. Patient had complete resolution of her headache, dizziness and blurred vision while in the ED. Repeat neuro exam is unremarkable. At this time patient will be discharged with refill on her blood pressure medicines, strict ED return precautions. Patient is aware of symptoms to monitor for that would warrant return to the ED.  Patient, ED treatment and discharge plan was discussed with supervising physician who is agreeable with plan.   Final Clinical Impressions(s) / ED Diagnoses   Final diagnoses:  Dizziness  Elevated blood pressure reading with diagnosis of hypertension  History of noncompliance with medical treatment    New Prescriptions Discharge Medication List as of 01/11/2017  3:22 PM       Kinnie Feil, PA-C 01/11/17 2013    Little, Wenda Overland, MD 01/12/17 [phone]

## 2017-01-11 NOTE — Discharge Instructions (Signed)
Your CT scan was normal today.    Please take your blood pressure medications daily to control your blood pressure and avoid complications in the future.   Monitor your blood pressure twice a day (morning and night) and keep track of your readings.  Return to the emergency department if you have elevated blood pressure (greater than 150/90) that is associated with sudden onset of headache, blurred vision, dizziness, chest pain, shortness of breath, cough, abdominal pain, back pain, numbness or weakness in your legs or hands.  These symptoms may suggest a hypertension emergency and require further evaluation in the emergency department.   Contact [facility] to establish care with a primary care provider for regular, routine medical care.  This clinic accepts patients without medical insurance. A primary care provider can adjust your daily medications and give you refills.

## 2017-01-11 NOTE — ED Triage Notes (Signed)
Pt reports HA and dizziness (room spinning) for the past few days. Pt has been off BP medications for the past 6 years. BP 171/103

## 2017-01-11 NOTE — ED Notes (Signed)
She c/o mild bilat. Temporal h/a and some dizziness recently. She denies ever losing her balance, nor having to lean against anything to maintain her balance. She states "When I feel it coming I sit down and it goes away". She states "I just came here for my blood pressure-I don't want all that 'sticking and stuff'".

## 2017-01-20 ENCOUNTER — Emergency Department (HOSPITAL_COMMUNITY): Payer: Medicare Other

## 2017-01-20 ENCOUNTER — Emergency Department (HOSPITAL_COMMUNITY)
Admission: EM | Admit: 2017-01-20 | Discharge: 2017-01-20 | Disposition: A | Discharge status: Home / Self Care | Payer: Medicare Other | Attending: Emergency Medicine | Admitting: Emergency Medicine

## 2017-01-20 ENCOUNTER — Encounter (HOSPITAL_COMMUNITY): Payer: Self-pay | Admitting: Emergency Medicine

## 2017-01-20 DIAGNOSIS — R079 Chest pain, unspecified: Secondary | ICD-10-CM | POA: Diagnosis not present

## 2017-01-20 DIAGNOSIS — R42 Dizziness and giddiness: Secondary | ICD-10-CM | POA: Diagnosis not present

## 2017-01-20 DIAGNOSIS — Z79899 Other long term (current) drug therapy: Secondary | ICD-10-CM | POA: Insufficient documentation

## 2017-01-20 DIAGNOSIS — Z87891 Personal history of nicotine dependence: Secondary | ICD-10-CM | POA: Insufficient documentation

## 2017-01-20 DIAGNOSIS — H8301 Labyrinthitis, right ear: Secondary | ICD-10-CM | POA: Insufficient documentation

## 2017-01-20 DIAGNOSIS — R0789 Other chest pain: Secondary | ICD-10-CM | POA: Insufficient documentation

## 2017-01-20 DIAGNOSIS — I10 Essential (primary) hypertension: Secondary | ICD-10-CM | POA: Insufficient documentation

## 2017-01-20 LAB — CBC
HCT: 32.1 % — ABNORMAL LOW (ref 36.0–46.0)
HEMOGLOBIN: 10.4 g/dL — AB (ref 12.0–15.0)
MCH: 23 pg — AB (ref 26.0–34.0)
MCHC: 32.4 g/dL (ref 30.0–36.0)
MCV: 70.9 fL — AB (ref 78.0–100.0)
Platelets: 479 10*3/uL — ABNORMAL HIGH (ref 150–400)
RBC: 4.53 MIL/uL (ref 3.87–5.11)
RDW: 14.3 % (ref 11.5–15.5)
WBC: 4.8 10*3/uL (ref 4.0–10.5)

## 2017-01-20 LAB — URINALYSIS, ROUTINE W REFLEX MICROSCOPIC
BILIRUBIN URINE: NEGATIVE
Glucose, UA: NEGATIVE mg/dL
Hgb urine dipstick: NEGATIVE
KETONES UR: NEGATIVE mg/dL
Nitrite: NEGATIVE
PROTEIN: 30 mg/dL — AB
Specific Gravity, Urine: 1.024 (ref 1.005–1.030)
pH: 5 (ref 5.0–8.0)

## 2017-01-20 LAB — BASIC METABOLIC PANEL
ANION GAP: 10 (ref 5–15)
BUN: 12 mg/dL (ref 6–20)
CHLORIDE: 100 mmol/L — AB (ref 101–111)
CO2: 26 mmol/L (ref 22–32)
Calcium: 10 mg/dL (ref 8.9–10.3)
Creatinine, Ser: 0.96 mg/dL (ref 0.44–1.00)
GFR calc Af Amer: >60 mL/min (ref >60)
GFR calc non Af Amer: >60 mL/min (ref >60)
Glucose, Bld: 130 mg/dL — ABNORMAL HIGH (ref 65–99)
Potassium: 3.9 mmol/L (ref 3.5–5.1)
SODIUM: 136 mmol/L (ref 135–145)

## 2017-01-20 LAB — POCT I-STAT TROPONIN I: TROPONIN I, POC: 0 ng/mL (ref 0.00–0.08)

## 2017-01-20 LAB — GLUCOSE, CAPILLARY: Glucose-Capillary: 148 mg/dL — ABNORMAL HIGH (ref 65–99)

## 2017-01-20 MED ORDER — IOPAMIDOL (ISOVUE-370) INJECTION 76%
INTRAVENOUS | Status: AC
  Filled 2017-01-20: qty 100

## 2017-01-20 MED ORDER — DEXAMETHASONE 4 MG PO TABS
10.0000 mg | ORAL_TABLET | Freq: Once | ORAL | Status: AC
  Administered 2017-01-20: 16:00:00 10 mg via ORAL
  Filled 2017-01-20: qty 2

## 2017-01-20 MED ORDER — SODIUM CHLORIDE 0.9 % IV BOLUS (SEPSIS)
1000.0000 mL | Freq: Once | INTRAVENOUS | Status: AC
  Administered 2017-01-20: 1000 mL via INTRAVENOUS

## 2017-01-20 MED ORDER — MECLIZINE HCL 25 MG PO TABS
25.0000 mg | ORAL_TABLET | Freq: Once | ORAL | Status: AC
  Administered 2017-01-20: 25 mg via ORAL
  Filled 2017-01-20: qty 1

## 2017-01-20 MED ORDER — IOPAMIDOL (ISOVUE-370) INJECTION 76%
100.0000 mL | Freq: Once | INTRAVENOUS | Status: AC | PRN
  Administered 2017-01-20: 100 mL via INTRAVENOUS

## 2017-01-20 NOTE — ED Notes (Signed)
Pt transported to CT ?

## 2017-01-20 NOTE — Discharge Instructions (Signed)
Take tylenol 2 pills 4 times a day and motrin 4 pills 3 times a day.  Drink plenty of fluids.  Return for worsening shortness of breath, headache, confusion. Follow up with your family doctor.   

## 2017-01-20 NOTE — ED Triage Notes (Signed)
Pt complaint of ongoing dizziness post start of blood pressure medications a week ago. Pt continues to verbalize constant central chest pain described as "something sitting on chest" with associated SOB. EKG obtained and given to [facility] aware of the above.

## 2017-01-20 NOTE — ED Provider Notes (Signed)
[facility] DEPT Provider Note   CSN: [id number] Arrival date & time: 01/20/17  1052     History   Chief Complaint Chief Complaint  Patient presents with  . Dizziness  . Chest Pain    HPI Angela Conner is a 46 y.o. female.  46 yo F with a chief complaint of dizziness. This is difficult for her to describe however she thinks it's more of a lightheadedness than vertigo. Worse with standing and exertion. Having some chest pain that she describes a pressure as well as shortness of breath with it. Denies diaphoresis or vomiting. The family history of MI. Patient with hypertension and hyperlipidemia. Denies recent surgery travel, leg swelling, hemoptysis, birth control use.  Having a cough and congestion as well.   The history is provided by the patient.  Dizziness  Quality:  Lightheadedness Severity:  Moderate Onset quality:  Gradual Duration:  3 weeks Timing:  Constant Progression:  Worsening Chronicity:  Chronic Context: physical activity and standing up   Relieved by:  Being still and change in position Worsened by:  Nothing Ineffective treatments:  None tried Associated symptoms: chest pain and shortness of breath   Associated symptoms: no headaches, no nausea, no palpitations and no vomiting   Risk factors: multiple medications and new medications   Risk factors: no hx of vertigo and no Meniere's disease   Chest Pain   Associated symptoms include cough, dizziness and shortness of breath. Pertinent negatives include no fever, no headaches, no nausea, no palpitations and no vomiting.    Past Medical History:  Diagnosis Date  . Bipolar 1 disorder ([place])   . Hypercholesteremia   . Hypertension   . Schizophrenia ([place])     There are no active problems to display for this patient.   History reviewed. No pertinent surgical history.  OB History    No data available       Home Medications    Prior to Admission medications   Medication Sig Start Date End  Date Taking? Authorizing Provider  clindamycin (CLEOCIN) 150 MG capsule Take 2 capsules (300 mg total) by mouth 3 (three) times daily. Patient not taking: Reported on 01/11/2017 05/22/15   Ashley Murrain, NP  diphenhydrAMINE (BENADRYL) 25 mg capsule Take 1 capsule (25 mg total) by mouth every 6 (six) hours as needed for itching. Patient not taking: Reported on 01/11/2017 02/15/13   Delos Haring, PA-C  hydrochlorothiazide (HYDRODIURIL) 25 MG tablet Take 1 tablet (25 mg total) by mouth daily. Patient not taking: Reported on 01/11/2017 02/15/13   Delos Haring, PA-C  lisinopril-hydrochlorothiazide (PRINZIDE,ZESTORETIC) 10-12.5 MG tablet Take 1 tablet by mouth daily. For bp. Patient not taking: Reported on 01/20/2017 01/11/17   Kinnie Feil, PA-C  traMADol (ULTRAM) 50 MG tablet Take 1 tablet (50 mg total) by mouth every 6 (six) hours as needed. Patient not taking: Reported on 01/11/2017 05/22/15   Ashley Murrain, NP    Family History No family history on file.  Social History Social History  Substance Use Topics  . Smoking status: Former Research scientist (life sciences)  . Smokeless tobacco: Never Used  . Alcohol use No     Allergies   Tylenol [acetaminophen]   Review of Systems Review of Systems  Constitutional: Negative for chills and fever.  HENT: Positive for congestion. Negative for rhinorrhea.   Eyes: Negative for redness and visual disturbance.  Respiratory: Positive for cough and shortness of breath. Negative for wheezing.   Cardiovascular: Positive for chest pain. Negative for palpitations.  Gastrointestinal: Negative for nausea and vomiting.  Genitourinary: Negative for dysuria and urgency.  Musculoskeletal: Negative for arthralgias and myalgias.  Skin: Negative for pallor and wound.  Neurological: Positive for dizziness. Negative for headaches.     Physical Exam Updated Vital Signs BP (!) 159/83 (BP Location: Left Arm)   Pulse 88   Temp 98 F (36.7 C) (Oral)   Resp 19   Ht 5\' 7"  (1.702  m)   Wt 90.7 kg (200 lb)   LMP 01/09/2017   SpO2 100%   BMI 31.32 kg/m   Physical Exam  Constitutional: She is oriented to person, place, and time. She appears well-developed and well-nourished. No distress.  HENT:  Head: Normocephalic and atraumatic.  Swollen turbinates, posterior nasal drip, no noted sinus ttp, Rt tm nml, left with serous effusion.    Eyes: EOM are normal. Pupils are equal, round, and reactive to light.  Neck: Normal range of motion. Neck supple.  Cardiovascular: Normal rate and regular rhythm.  Exam reveals no gallop and no friction rub.   No murmur heard. Pulmonary/Chest: Effort normal. She has no wheezes. She has no rales.  Abdominal: Soft. She exhibits no distension and no mass. There is no tenderness. There is no guarding.  Musculoskeletal: She exhibits no edema or tenderness.  Neurological: She is alert and oriented to person, place, and time. She has normal strength. No cranial nerve deficit or sensory deficit. She displays a negative Romberg sign. She displays no Babinski's sign on the right side. She displays no Babinski's sign on the left side.  Reflex Scores:      Tricep reflexes are 2+ on the right side and 2+ on the left side.      Bicep reflexes are 2+ on the right side and 2+ on the left side.      Brachioradialis reflexes are 2+ on the right side and 2+ on the left side.      Patellar reflexes are 2+ on the right side and 2+ on the left side.      Achilles reflexes are 2+ on the right side and 2+ on the left side. R going fast nystagmus  Skin: Skin is warm and dry. She is not diaphoretic.  Psychiatric: She has a normal mood and affect. Her behavior is normal.  Nursing note and vitals reviewed.    ED Treatments / Results  Labs (all labs ordered are listed, but only abnormal results are displayed) Labs Reviewed  BASIC METABOLIC PANEL - Abnormal; Notable for the following:       Result Value   Chloride 100 (*)    Glucose, Bld 130 (*)    All  other components within normal limits  CBC - Abnormal; Notable for the following:    Hemoglobin 10.4 (*)    HCT 32.1 (*)    MCV 70.9 (*)    MCH 23.0 (*)    Platelets 479 (*)    All other components within normal limits  URINALYSIS, ROUTINE W REFLEX MICROSCOPIC - Abnormal; Notable for the following:    APPearance HAZY (*)    Protein, ur 30 (*)    Leukocytes, UA TRACE (*)    Bacteria, UA RARE (*)    Squamous Epithelial / LPF 6-30 (*)    All other components within normal limits  GLUCOSE, CAPILLARY - Abnormal; Notable for the following:    Glucose-Capillary 148 (*)    All other components within normal limits  I-STAT TROPOININ, ED  CBG MONITORING, ED  POCT  I-STAT TROPONIN I    EKG  EKG Interpretation  Date/Time:  Monday Jan 20 2017 11:03:25 EDT Ventricular Rate:  109 PR Interval:    QRS Duration: 91 QT Interval:  296 QTC Calculation: 399 R Axis:   82 Text Interpretation:  Sinus tachycardia with irregular rate Nonspecific repol abnormality, diffuse leads Baseline wander TECHNICALLY DIFFICULT downsloping st segments new from prior Otherwise no significant change Confirmed by Deno Etienne [phone]) on 01/20/2017 12:00:33 PM       Radiology Dg Chest 2 View  Result Date: 01/20/2017 CLINICAL DATA:  Ongoing dizziness, started blood pressure medication 1 week ago, chest pain/pressure, history hypertension, hypercholesterolemia, former smoker EXAM: CHEST  2 VIEW COMPARISON:  03/09/2012 FINDINGS: Normal heart size, mediastinal contours, and pulmonary vascularity. Minimal subsegmental atelectasis at LEFT costophrenic angle. Lungs otherwise clear. No pulmonary infiltrate, pleural effusion or pneumothorax. Bones unremarkable. IMPRESSION: Minimal subsegmental atelectasis at LEFT base. Electronically Signed   By: Lavonia Dana M.D.   On: 01/20/2017 11:39   Ct Angio Chest Pe W Or Wo Contrast  Result Date: 01/20/2017 CLINICAL DATA:  Dizziness, started after beginning at blood pressure medication a  week ago, central chest pain described as something sitting on her chest, history hypertension, former smoker EXAM: CT ANGIOGRAPHY CHEST WITH CONTRAST TECHNIQUE: Multidetector CT imaging of the chest was performed using the standard protocol during bolus administration of intravenous contrast. Multiplanar CT image reconstructions and MIPs were obtained to evaluate the vascular anatomy. CONTRAST:  100 cc Isovue 370 IV COMPARISON:  10/16/2006 FINDINGS: Cardiovascular: Aorta normal caliber without aneurysm or dissection. No pericardial effusion. Pulmonary arteries well opacified an patent. No evidence of pulmonary embolism. Mediastinum/Nodes: Significant mediastinal and hilar adenopathy including AP window node 13 mm short axis previously 22 mm, precarinal node 15 mm previously 19 mm, RIGHT hilar node 16 mm unchanged, and confluent subcarinal adenopathy, slightly increased. Numerous though normal sized axillary nodes bilaterally, largest 12 mm short axis RIGHT axilla image 23. Chronicity these changes since 2008 make this more likely represent a benign process such as sarcoidosis or Castlemann disease, less likely reactive adenopathy, with lymphoma or metastatic disease unlikely due to duration of stability. Esophagus unremarkable. Base of cervical region normal appearance. Lungs/Pleura: Scattered atelectasis at lower lungs. No definite infiltrate, pleural effusion, or pneumothorax. Questionable nodular density versus atelectasis/scarring or pleural plaque at RIGHT diaphragm 11 mm diameter image 57, favor atelectasis versus scarring. No additional pulmonary mass/nodule. Upper Abdomen: Unremarkable Musculoskeletal: Normal appearance Review of the MIP images confirms the above findings. IMPRESSION: No evidence of pulmonary embolism. Persistent significant mediastinal and hilar adenopathy little changed since 2008 favoring a benign process such as sarcoidosis or Castlemann disease as discussed above. Single questionable  nodular density versus area of atelectasis or scarring or less likely pleural plaque formation at the RIGHT diaphragm, favor atelectasis versus scarring ; followup CT chest recommended in 3 months to assess stability. Electronically Signed   By: Lavonia Dana M.D.   On: 01/20/2017 13:58    Procedures Procedures (including critical care time)  Medications Ordered in ED Medications  iopamidol (ISOVUE-370) 76 % injection (not administered)  dexamethasone (DECADRON) tablet 10 mg (not administered)  sodium chloride 0.9 % bolus 1,000 mL (1,000 mLs Intravenous New Bag/Given 01/20/17 1247)  meclizine (ANTIVERT) tablet 25 mg (25 mg Oral Given 01/20/17 1247)  iopamidol (ISOVUE-370) 76 % injection 100 mL (100 mLs Intravenous Contrast Given 01/20/17 1316)     Initial Impression / Assessment and Plan / ED Course  I have reviewed the  triage vital signs and the nursing notes.  Pertinent labs & imaging results that were available during my care of the patient were reviewed by me and considered in my medical decision making (see chart for details).     46 yo F with dizziness. Multiple possible factors, difficult hx. I think most likely this is all URI related, though have difficulty with exertional cp, sob with lightheadedness. Patient was tachycardic on arrival with some new ST changes as well. Will obtain a CAT scan of the chest to evaluate for PE.  CT scan negative for PE. On reassessment the patient states that her dizziness is better. States the room is no longer spinning. When asked why she did not tell me that she had a spinning sensation before she thought that she did. Will treat as labyrinthitis. PCP follow-up.  3:54 PM:  I have discussed the diagnosis/risks/treatment options with the patient and family and believe the pt to be eligible for discharge home to follow-up with PCP. We also discussed returning to the ED immediately if new or worsening sx occur. We discussed the sx which are most concerning  (e.g., sudden worsening pain, fever, inability to tolerate by mouth) that necessitate immediate return. Medications administered to the patient during their visit and any new prescriptions provided to the patient are listed below.  Medications given during this visit Medications  iopamidol (ISOVUE-370) 76 % injection (not administered)  dexamethasone (DECADRON) tablet 10 mg (not administered)  sodium chloride 0.9 % bolus 1,000 mL (1,000 mLs Intravenous New Bag/Given 01/20/17 1247)  meclizine (ANTIVERT) tablet 25 mg (25 mg Oral Given 01/20/17 1247)  iopamidol (ISOVUE-370) 76 % injection 100 mL (100 mLs Intravenous Contrast Given 01/20/17 1316)     The patient appears reasonably screen and/or stabilized for discharge and I doubt any other medical condition or other [facility] requiring further screening, evaluation, or treatment in the ED at this time prior to discharge.    Final Clinical Impressions(s) / ED Diagnoses   Final diagnoses:  Chest pain  Dizziness  Labyrinthitis of right ear    New Prescriptions New Prescriptions   No medications on file     Deno Etienne, DO 01/20/17 1554

## 2017-02-21 ENCOUNTER — Ambulatory Visit: Payer: Medicare Other | Admitting: Internal Medicine

## 2017-04-08 DIAGNOSIS — Z5181 Encounter for therapeutic drug level monitoring: Secondary | ICD-10-CM | POA: Diagnosis not present

## 2017-04-27 ENCOUNTER — Encounter (HOSPITAL_COMMUNITY): Payer: Self-pay

## 2017-04-27 ENCOUNTER — Emergency Department (HOSPITAL_COMMUNITY)
Admission: EM | Admit: 2017-04-27 | Discharge: 2017-04-27 | Disposition: A | Discharge status: Home / Self Care | Payer: Medicare Other | Attending: Emergency Medicine | Admitting: Emergency Medicine

## 2017-04-27 DIAGNOSIS — L299 Pruritus, unspecified: Secondary | ICD-10-CM | POA: Diagnosis not present

## 2017-04-27 DIAGNOSIS — Z87891 Personal history of nicotine dependence: Secondary | ICD-10-CM | POA: Diagnosis not present

## 2017-04-27 DIAGNOSIS — I10 Essential (primary) hypertension: Secondary | ICD-10-CM | POA: Diagnosis not present

## 2017-04-27 DIAGNOSIS — D649 Anemia, unspecified: Secondary | ICD-10-CM | POA: Insufficient documentation

## 2017-04-27 MED ORDER — HYDROXYZINE HCL 25 MG PO TABS
25.0000 mg | ORAL_TABLET | Freq: Once | ORAL | Status: AC
  Administered 2017-04-27: 25 mg via ORAL
  Filled 2017-04-27: qty 1

## 2017-04-27 MED ORDER — HYDROXYZINE HCL 25 MG PO TABS: 25.0000 mg | ORAL_TABLET | Freq: Four times a day (QID) | ORAL | 0 refills | Status: DC

## 2017-04-27 MED ORDER — DIPHENHYDRAMINE HCL 25 MG PO CAPS
50.0000 mg | ORAL_CAPSULE | Freq: Once | ORAL | Status: AC
  Administered 2017-04-27: 50 mg via ORAL
  Filled 2017-04-27: qty 2

## 2017-04-27 MED ORDER — TRIAMCINOLONE ACETONIDE 0.1 % EX CREA: 1.0000 "application " | TOPICAL_CREAM | Freq: Two times a day (BID) | CUTANEOUS | 0 refills | Status: DC

## 2017-04-27 NOTE — ED Triage Notes (Signed)
patient complains of itching since Thursday. Has taking allergy medication with minimal relief. No distress. Skin red from scratching. No SOB

## 2017-04-27 NOTE — Discharge Instructions (Signed)
You may take 1 tablet of Atarax every 6 hours as needed for itching. You may apply triamcinolone cream to the rash up to 2 times per day. 25-50 mg of benadryl may be taken every 4 to 8 hours. These medications can make you drowsy so please be careful with driving or working.  Your 2 labs are pending. If positive, someone from the lab will call you. You can also download the MyChart app on your smartphone to check your lab results. If your symptoms do not improve in the next 3-4 days, please follow up with primary care.   If you develop worsening symptoms, including feeling as if your throat is closing, swelling of your lips, or difficulty breathing, please return to the emergency department for reevaluation.

## 2017-04-27 NOTE — ED Notes (Signed)
C/o "itching all over..even on my head" since Thursday (3 days ago). Has tried Benadryl, Zyrtec, Ginger tea w/o improvement.

## 2017-04-27 NOTE — ED Provider Notes (Signed)
[facility] DEPT Provider Note   CSN: [id number] Arrival date & time: 04/27/17  0957     History   Chief Complaint Chief Complaint  Patient presents with  . Pruritis    HPI Angela Conner is a 46 y.o. female who presents to the emergency department with a chief complaint pruritus for 3 days. She states that the itching started on her bilateral arms and has since spread to her abdomen, groin, face, and bilateral hands. She gets a rash to her bilateral arms and hands and over the abdomen. No rash to the face or scalp. She lives with her son who does not have similar symptoms. No new soaps, lotions, wearing new clothes that haven't been washed, sleeping in a new environment, or new foods, or detergents. She has treated her symptoms with over 20 tablets of Benadryl the first 2 days, Zyrtec, green tea, and drinking apple cider vinegar without improvement.  She reports a history of iron deficiency anemia and HTN. She denies abdominal pain, particularly pain that is worse after eating, fever, chills, N/V/D, or dyspnea. She reports she was tested for HIV around one year ago. She denies ever being tested for syphilis. No history of similar. No history of IV or recreational drug use.   The history is provided by the patient. No language interpreter was used.    Past Medical History:  Diagnosis Date  . Bipolar 1 disorder ([place])   . Hypercholesteremia   . Hypertension   . Schizophrenia ([place])     There are no active problems to display for this patient.   History reviewed. No pertinent surgical history.  OB History    No data available       Home Medications    Prior to Admission medications   Medication Sig Start Date End Date Taking? Authorizing Provider  hydrOXYzine (ATARAX/VISTARIL) 25 MG tablet Take 1 tablet (25 mg total) by mouth every 6 (six) hours. 04/27/17   McDonald, Mia A, PA-C  triamcinolone cream (KENALOG) 0.1 % Apply 1 application topically 2 (two) times daily. 04/27/17    McDonald, Mia A, PA-C    Family History No family history on file.  Social History Social History  Substance Use Topics  . Smoking status: Former Research scientist (life sciences)  . Smokeless tobacco: Never Used  . Alcohol use No     Allergies   Tylenol [acetaminophen]   Review of Systems Review of Systems  Constitutional: Negative for activity change, chills and fever.  Respiratory: Negative for shortness of breath.   Cardiovascular: Negative for chest pain.  Gastrointestinal: Negative for abdominal pain, diarrhea, nausea and vomiting.  Musculoskeletal: Negative for back pain.  Skin: Positive for rash.       Pruritus   Physical Exam Updated Vital Signs BP (!) 152/100   Pulse 88   Temp 98.8 F (37.1 C) (Oral)   Resp 16   SpO2 100%   Physical Exam  Constitutional: No distress.  HENT:  Head: Normocephalic.  No angioedema.  Eyes: Conjunctivae are normal.  Neck: Neck supple.  Cardiovascular: Normal rate and regular rhythm.  Exam reveals no gallop and no friction rub.   No murmur heard. Pulmonary/Chest: Effort normal. No respiratory distress. She has no wheezes.  Abdominal: Soft. She exhibits no distension.  Neurological: She is alert.  Skin: Skin is warm. No rash noted.  Macular erythematous rash noted to the bilateral palms and fingers. No lesions in the interdigital space. There are few 2 mm maculopapular lesions noted to the bilateral  forearms. No rash noted to the bilateral upper arms. The rash is more concentrated on the flexor surfaces of the wrist and elbows.  The bilateral lower abdomen is red, consistent with itching, but there does not appear to be a rash present. The back and legs are spared.  No lesions noted on the face or scalp other than linear scratches consistent with itching.  Psychiatric: Her behavior is normal.  Nursing note and vitals reviewed.    ED Treatments / Results  Labs (all labs ordered are listed, but only abnormal results are displayed) Labs Reviewed   RPR  HIV ANTIBODY (ROUTINE TESTING)    EKG  EKG Interpretation None       Radiology No results found.  Procedures Procedures (including critical care time)  Medications Ordered in ED Medications  hydrOXYzine (ATARAX/VISTARIL) tablet 25 mg (25 mg Oral Given 04/27/17 1115)  diphenhydrAMINE (BENADRYL) capsule 50 mg (50 mg Oral Given 04/27/17 1116)     Initial Impression / Assessment and Plan / ED Course  I have reviewed the triage vital signs and the nursing notes.  Pertinent labs & imaging results that were available during my care of the patient were reviewed by me and considered in my medical decision making (see chart for details).     47 year old female with a history of hypertension and iron deficiency anemia presenting with diffuse pruritus and rash to the bilateral hands and arms. Linear abrasions consistent with scratching are present over the bilateral abdomen and on the face. SaO2 100% on RA.   Most likely this is a contact dermatitis, however an inciting allergen has not been identified. No sick contacts with similar symptoms. Pruritus improved in the emergency Department with Atarax and Benadryl. Will discharge the patient with similar. RPR and HIV pending. Discussed with the patient that if the rash does not improve in the next 3-4 days to follow up with her primary care provider. Discussed that there can be other causes for her symptoms, such as her history of iron deficiency anemia that could be responsible for her itching today. Discussed the plan with the patient has agreeable at this time. Vital signs stable. No acute distress. Strict return precautions given. Will discharge the patient to home at this time.  Final Clinical Impressions(s) / ED Diagnoses   Final diagnoses:  Pruritus    New Prescriptions New Prescriptions   HYDROXYZINE (ATARAX/VISTARIL) 25 MG TABLET    Take 1 tablet (25 mg total) by mouth every 6 (six) hours.   TRIAMCINOLONE CREAM (KENALOG) 0.1  %    Apply 1 application topically 2 (two) times daily.     Joline Maxcy A, PA-C 04/27/17 1316    Gareth Morgan, MD 04/29/17 0004

## 2017-04-28 LAB — RPR: RPR: NONREACTIVE

## 2017-04-29 LAB — HIV ANTIBODY (ROUTINE TESTING W REFLEX): HIV SCREEN 4TH GENERATION: NONREACTIVE

## 2017-04-30 DIAGNOSIS — E669 Obesity, unspecified: Secondary | ICD-10-CM | POA: Diagnosis not present

## 2017-04-30 DIAGNOSIS — I10 Essential (primary) hypertension: Secondary | ICD-10-CM | POA: Diagnosis not present

## 2017-04-30 DIAGNOSIS — Z09 Encounter for follow-up examination after completed treatment for conditions other than malignant neoplasm: Secondary | ICD-10-CM | POA: Diagnosis not present

## 2017-04-30 DIAGNOSIS — L299 Pruritus, unspecified: Secondary | ICD-10-CM | POA: Diagnosis not present

## 2017-05-23 DIAGNOSIS — F250 Schizoaffective disorder, bipolar type: Secondary | ICD-10-CM | POA: Diagnosis not present

## 2017-08-01 DIAGNOSIS — Z9114 Patient's other noncompliance with medication regimen: Secondary | ICD-10-CM | POA: Diagnosis not present

## 2017-08-01 DIAGNOSIS — Z886 Allergy status to analgesic agent status: Secondary | ICD-10-CM | POA: Diagnosis not present

## 2017-08-01 DIAGNOSIS — I10 Essential (primary) hypertension: Secondary | ICD-10-CM | POA: Diagnosis not present

## 2017-08-01 DIAGNOSIS — R110 Nausea: Secondary | ICD-10-CM | POA: Diagnosis not present

## 2017-08-01 DIAGNOSIS — Z79899 Other long term (current) drug therapy: Secondary | ICD-10-CM | POA: Diagnosis not present

## 2017-08-01 DIAGNOSIS — R51 Headache: Secondary | ICD-10-CM | POA: Diagnosis not present

## 2018-02-02 DIAGNOSIS — Z113 Encounter for screening for infections with a predominantly sexual mode of transmission: Secondary | ICD-10-CM | POA: Diagnosis not present

## 2018-02-02 DIAGNOSIS — I10 Essential (primary) hypertension: Secondary | ICD-10-CM | POA: Diagnosis not present

## 2018-02-02 DIAGNOSIS — Z0000 Encounter for general adult medical examination without abnormal findings: Secondary | ICD-10-CM | POA: Diagnosis not present

## 2018-02-02 DIAGNOSIS — Z13220 Encounter for screening for lipoid disorders: Secondary | ICD-10-CM | POA: Diagnosis not present

## 2018-02-02 DIAGNOSIS — E669 Obesity, unspecified: Secondary | ICD-10-CM | POA: Diagnosis not present

## 2018-02-02 DIAGNOSIS — Z114 Encounter for screening for human immunodeficiency virus [HIV]: Secondary | ICD-10-CM | POA: Diagnosis not present

## 2018-02-02 DIAGNOSIS — Z1329 Encounter for screening for other suspected endocrine disorder: Secondary | ICD-10-CM | POA: Diagnosis not present

## 2018-02-02 DIAGNOSIS — Z124 Encounter for screening for malignant neoplasm of cervix: Secondary | ICD-10-CM | POA: Diagnosis not present

## 2018-03-16 DIAGNOSIS — F251 Schizoaffective disorder, depressive type: Secondary | ICD-10-CM | POA: Diagnosis not present

## 2018-03-30 ENCOUNTER — Encounter (HOSPITAL_COMMUNITY): Payer: Self-pay

## 2018-03-30 ENCOUNTER — Emergency Department (HOSPITAL_COMMUNITY)
Admission: EM | Admit: 2018-03-30 | Discharge: 2018-03-30 | Disposition: A | Discharge status: Other Acute Inpatient Hospital | Payer: Medicare Other | Attending: Emergency Medicine | Admitting: Emergency Medicine

## 2018-03-30 ENCOUNTER — Inpatient Hospital Stay (HOSPITAL_COMMUNITY)
Admission: AD | Admit: 2018-03-30 | Discharge: 2018-04-03 | DRG: 885 | Disposition: A | Discharge status: Home / Self Care | Payer: Medicaid Other | Source: Intra-hospital | Attending: Psychiatry | Admitting: Psychiatry

## 2018-03-30 ENCOUNTER — Other Ambulatory Visit: Payer: Self-pay

## 2018-03-30 ENCOUNTER — Encounter (HOSPITAL_COMMUNITY): Payer: Self-pay | Admitting: Emergency Medicine

## 2018-03-30 DIAGNOSIS — F250 Schizoaffective disorder, bipolar type: Secondary | ICD-10-CM | POA: Insufficient documentation

## 2018-03-30 DIAGNOSIS — R45851 Suicidal ideations: Secondary | ICD-10-CM | POA: Insufficient documentation

## 2018-03-30 DIAGNOSIS — J3089 Other allergic rhinitis: Secondary | ICD-10-CM | POA: Diagnosis present

## 2018-03-30 DIAGNOSIS — I10 Essential (primary) hypertension: Secondary | ICD-10-CM | POA: Diagnosis present

## 2018-03-30 DIAGNOSIS — Z87891 Personal history of nicotine dependence: Secondary | ICD-10-CM

## 2018-03-30 DIAGNOSIS — Z915 Personal history of self-harm: Secondary | ICD-10-CM

## 2018-03-30 DIAGNOSIS — D509 Iron deficiency anemia, unspecified: Secondary | ICD-10-CM | POA: Diagnosis present

## 2018-03-30 DIAGNOSIS — F4310 Post-traumatic stress disorder, unspecified: Secondary | ICD-10-CM | POA: Diagnosis present

## 2018-03-30 DIAGNOSIS — R51 Headache: Secondary | ICD-10-CM | POA: Insufficient documentation

## 2018-03-30 DIAGNOSIS — G4700 Insomnia, unspecified: Secondary | ICD-10-CM | POA: Diagnosis not present

## 2018-03-30 DIAGNOSIS — Z046 Encounter for general psychiatric examination, requested by authority: Secondary | ICD-10-CM | POA: Insufficient documentation

## 2018-03-30 DIAGNOSIS — Z79899 Other long term (current) drug therapy: Secondary | ICD-10-CM | POA: Insufficient documentation

## 2018-03-30 DIAGNOSIS — R0981 Nasal congestion: Secondary | ICD-10-CM | POA: Insufficient documentation

## 2018-03-30 LAB — COMPREHENSIVE METABOLIC PANEL
ALK PHOS: 37 U/L — AB (ref 38–126)
ALT: 18 U/L (ref 0–44)
AST: 22 U/L (ref 15–41)
Albumin: 3.8 g/dL (ref 3.5–5.0)
Anion gap: 6 (ref 5–15)
BUN: 10 mg/dL (ref 6–20)
CALCIUM: 9.8 mg/dL (ref 8.9–10.3)
CHLORIDE: 100 mmol/L (ref 98–111)
CO2: 29 mmol/L (ref 22–32)
Creatinine, Ser: 0.73 mg/dL (ref 0.44–1.00)
Glucose, Bld: 111 mg/dL — ABNORMAL HIGH (ref 70–99)
Potassium: 4.2 mmol/L (ref 3.5–5.1)
Sodium: 135 mmol/L (ref 135–145)
Total Bilirubin: 0.9 mg/dL (ref 0.3–1.2)
Total Protein: 7.6 g/dL (ref 6.5–8.1)

## 2018-03-30 LAB — CBC
HCT: 32.1 % — ABNORMAL LOW (ref 36.0–46.0)
Hemoglobin: 9.9 g/dL — ABNORMAL LOW (ref 12.0–15.0)
MCH: 22.8 pg — AB (ref 26.0–34.0)
MCHC: 30.8 g/dL (ref 30.0–36.0)
MCV: 74 fL — AB (ref 78.0–100.0)
PLATELETS: 364 10*3/uL (ref 150–400)
RBC: 4.34 MIL/uL (ref 3.87–5.11)
RDW: 15.2 % (ref 11.5–15.5)
WBC: 4.9 10*3/uL (ref 4.0–10.5)

## 2018-03-30 LAB — ACETAMINOPHEN LEVEL: Acetaminophen (Tylenol), Serum: <10 ug/mL — ABNORMAL LOW (ref 10–30)

## 2018-03-30 LAB — RAPID URINE DRUG SCREEN, HOSP PERFORMED
Amphetamines: NOT DETECTED
Barbiturates: NOT DETECTED
Benzodiazepines: NOT DETECTED
Cocaine: NOT DETECTED
Opiates: NOT DETECTED
Tetrahydrocannabinol: POSITIVE — AB

## 2018-03-30 LAB — I-STAT BETA HCG BLOOD, ED (MC, WL, AP ONLY): I-stat hCG, quantitative: <5 m[IU]/mL (ref <5)

## 2018-03-30 LAB — SALICYLATE LEVEL

## 2018-03-30 LAB — ETHANOL

## 2018-03-30 MED ORDER — ALUM & MAG HYDROXIDE-SIMETH 200-200-20 MG/5ML PO SUSP: 30.0000 mL | ORAL | Status: DC | PRN

## 2018-03-30 MED ORDER — OXYMETAZOLINE HCL 0.05 % NA SOLN
1.0000 | Freq: Two times a day (BID) | NASAL | Status: AC
Start: 2018-03-30 — End: 2018-03-31
  Administered 2018-03-30 – 2018-03-31 (×3): 1 via NASAL
  Filled 2018-03-30: qty 15

## 2018-03-30 MED ORDER — PSEUDOEPHEDRINE HCL ER 120 MG PO TB12
120.0000 mg | ORAL_TABLET | Freq: Two times a day (BID) | ORAL | Status: DC
  Administered 2018-03-30: 120 mg via ORAL
  Filled 2018-03-30 (×2): qty 1

## 2018-03-30 MED ORDER — RISPERIDONE 0.5 MG PO TABS
0.5000 mg | ORAL_TABLET | Freq: Two times a day (BID) | ORAL | Status: DC
  Administered 2018-03-30: 0.5 mg via ORAL
  Filled 2018-03-30: qty 1

## 2018-03-30 MED ORDER — OXYMETAZOLINE HCL 0.05 % NA SOLN
1.0000 | Freq: Two times a day (BID) | NASAL | Status: DC
  Administered 2018-03-30: 1 via NASAL
  Filled 2018-03-30: qty 15

## 2018-03-30 MED ORDER — CITALOPRAM HYDROBROMIDE 10 MG PO TABS
10.0000 mg | ORAL_TABLET | Freq: Every day | ORAL | Status: DC
  Administered 2018-03-30: 10 mg via ORAL
  Filled 2018-03-30: qty 1

## 2018-03-30 MED ORDER — LORATADINE 10 MG PO TABS
10.0000 mg | ORAL_TABLET | Freq: Every day | ORAL | Status: DC
  Administered 2018-03-30: 10 mg via ORAL

## 2018-03-30 MED ORDER — MAGNESIUM HYDROXIDE 400 MG/5ML PO SUSP: 30.0000 mL | Freq: Every day | ORAL | Status: DC | PRN

## 2018-03-30 MED ORDER — RISPERIDONE 0.5 MG PO TABS
0.5000 mg | ORAL_TABLET | Freq: Two times a day (BID) | ORAL | Status: DC
  Administered 2018-03-30 – 2018-03-31 (×2): 0.5 mg via ORAL
  Filled 2018-03-30 (×5): qty 1

## 2018-03-30 MED ORDER — PSEUDOEPHEDRINE HCL ER 120 MG PO TB12
120.0000 mg | ORAL_TABLET | Freq: Two times a day (BID) | ORAL | Status: DC
  Administered 2018-03-30 – 2018-04-03 (×8): 120 mg via ORAL
  Filled 2018-03-30 (×13): qty 1

## 2018-03-30 MED ORDER — TRAZODONE HCL 50 MG PO TABS
50.0000 mg | ORAL_TABLET | Freq: Every evening | ORAL | Status: DC | PRN
  Administered 2018-03-30 – 2018-04-02 (×4): 50 mg via ORAL
  Filled 2018-03-30 (×3): qty 1

## 2018-03-30 MED ORDER — PHENYLEPHRINE HCL 10 MG PO TABS
10.0000 mg | ORAL_TABLET | Freq: Once | ORAL | Status: DC
  Filled 2018-03-30: qty 1

## 2018-03-30 MED ORDER — CITALOPRAM HYDROBROMIDE 10 MG PO TABS
10.0000 mg | ORAL_TABLET | Freq: Every day | ORAL | Status: DC
  Administered 2018-03-31 – 2018-04-03 (×4): 10 mg via ORAL
  Filled 2018-03-30 (×6): qty 1

## 2018-03-30 MED ORDER — LORATADINE 10 MG PO TABS
10.0000 mg | ORAL_TABLET | Freq: Every day | ORAL | Status: DC
  Filled 2018-03-30: qty 1

## 2018-03-30 MED ORDER — LORATADINE 10 MG PO TABS
10.0000 mg | ORAL_TABLET | Freq: Every day | ORAL | Status: DC
  Administered 2018-03-31 – 2018-04-03 (×4): 10 mg via ORAL
  Filled 2018-03-30 (×6): qty 1

## 2018-03-30 NOTE — ED Notes (Signed)
Sitter now at bedside.
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
## 2018-03-30 NOTE — Progress Notes (Signed)
Pt accepted to [facility], room #508-2 Elmarie Shiley, NP is the accepting provider.   Dr. Nancy Fetter is the attending provider.   Call report to [place] @ [facility] ED notified.   Pt is voluntary and can be transported by [facility].  Pt is scheduled to arrive at [facility] 9:30pm.   Audree Camel, [place], [place] Disposition CSW [facility]/TTS [phone]

## 2018-03-30 NOTE — ED Triage Notes (Signed)
Pt. Stated, Im just congested and can't breath. Using Afrin about every hour and its not working now.

## 2018-03-30 NOTE — ED Notes (Signed)
Regular Diet was ordered for Dinner. 

## 2018-03-30 NOTE — ED Notes (Signed)
Presents to ED with nasal congestion x1 month - states has been using multiple nasal sprays without relief; additional, reports auditory hallucinations for "years" - when asked what was different in reference to voices, she replied that since she has had nasal congestion, auditory hallucinations have increased - reports they are telling her to "off" herself since she "can't breathe anyway; admits to SI - states I will hurt myself if I can get my hands on something; denies HI, known psych history - reports has been taking meds as prescribed with exception of this am - did not take doses; previous admission for psych approx 7-8 yrs ago for suicide attempt (overdose on Tylenol) - presently awake, alert, oriented x4 - calm, cooperative, maintains good eye contact, speech tone and rate appropriate; also reports "I am a cutter" - no obvious recent injuries noted at this time

## 2018-03-30 NOTE — ED Notes (Signed)
Pt is unable to provide sample of urine at this time. Pt is aware that urine sample is needed. Pt has urine cup at bedside. Will try again later.

## 2018-03-30 NOTE — ED Provider Notes (Signed)
[facility] EMERGENCY DEPARTMENT Provider Note   CSN: [id number] Arrival date & time: 03/30/18  0744     History   Chief Complaint Chief Complaint  Patient presents with  . Nasal Congestion  . Hallucinations    HPI Angela Conner is a 47 y.o. female.  HPI   Angela Conner is a 47 year old female with a history of schizophrenia, bipolar 1 disorder, hypertension, hyperlipidemia who presents to the emergency department for evaluation of nasal congestion and suicidal ideation with auditory hallucinations.  Patient reports that she has had nasal congestion for the past month.  Is using Afrin every hour.  States that it is difficult to breathe given so much nasal congestion.  Denies rhinorrhea.  She denies sinus pressure/pain.  Does have a frontal headache which is moderate.  She reports that she hears voices in her head for the past 16 years.  Recently they have been telling her to kill herself.  She reports several prior suicide attempt by drug ingestion.  She denies ingestion today.  Denies homicidal ideation.  Occasionally has visual hallucinations of a dark man inside of her room.  She denies sore throat, cough, ear pain, hearing loss, chest pain, abdominal pain, nausea/vomiting, lightheadedness or syncope.  Past Medical History:  Diagnosis Date  . Bipolar 1 disorder ([place])   . Hypercholesteremia   . Hypertension   . Schizophrenia ([place])     There are no active problems to display for this patient.   History reviewed. No pertinent surgical history.   OB History   None      Home Medications    Prior to Admission medications   Medication Sig Start Date End Date Taking? Authorizing Provider  hydrOXYzine (ATARAX/VISTARIL) 25 MG tablet Take 1 tablet (25 mg total) by mouth every 6 (six) hours. 04/27/17   McDonald, Mia A, PA-C  triamcinolone cream (KENALOG) 0.1 % Apply 1 application topically 2 (two) times daily. 04/27/17   McDonald, Mia A, PA-C    Family  History No family history on file.  Social History Social History   Tobacco Use  . Smoking status: Former Research scientist (life sciences)  . Smokeless tobacco: Never Used  Substance Use Topics  . Alcohol use: No  . Drug use: Yes    Types: Marijuana     Allergies   Tylenol [acetaminophen]   Review of Systems Review of Systems  Constitutional: Negative for chills and fever.  HENT: Positive for congestion. Negative for ear discharge, ear pain, facial swelling, hearing loss, rhinorrhea, sinus pressure, sinus pain, sore throat and trouble swallowing.   Eyes: Negative for visual disturbance.  Respiratory: Negative for shortness of breath.   Cardiovascular: Negative for chest pain.  Gastrointestinal: Negative for abdominal pain, nausea and vomiting.  Genitourinary: Negative for difficulty urinating and dysuria.  Musculoskeletal: Negative for gait problem.  Skin: Negative for rash and wound.  Neurological: Positive for headaches. Negative for syncope and light-headedness.  Psychiatric/Behavioral: Negative for agitation.     Physical Exam Updated Vital Signs BP (!) 158/109 (BP Location: Right Arm)   Pulse 84   Temp 97.9 F (36.6 C) (Oral)   Resp 16   Ht 5\' 7"  (1.702 m)   Wt 93 kg (205 lb)   LMP 03/09/2018   SpO2 100%   BMI 32.11 kg/m   Physical Exam  Constitutional: She is oriented to person, place, and time. She appears well-developed and well-nourished. No distress.  Nontoxic-appearing.  HENT:  Head: Normocephalic and atraumatic.  Nasal mucosa pink and  boggy.  Clear rhinorrhea noted.  No tender over the maxillary or frontal sinuses.  Bilateral TMs with good cone of light.  Oropharynx without erythema or tonsillar exudate.  Airway patent.  Eyes: Pupils are equal, round, and reactive to light. Conjunctivae are normal. Right eye exhibits no discharge. Left eye exhibits no discharge.  Neck: Normal range of motion. Neck supple.  Cardiovascular: Normal rate, regular rhythm and intact distal  pulses.  Pulmonary/Chest: Effort normal and breath sounds normal. No stridor. No respiratory distress. She has no wheezes. She has no rales.  Abdominal: Soft. Bowel sounds are normal. There is no tenderness.  Musculoskeletal: Normal range of motion.  Lymphadenopathy:    She has no cervical adenopathy.  Neurological: She is alert and oriented to person, place, and time. Coordination normal.  Gait normal and coordination and balance.  Skin: Skin is warm and dry. Capillary refill takes less than 2 seconds. She is not diaphoretic.  Psychiatric:  Patient appears well kept.  Voice appropriate speed and volume.  She endorses suicidal ideation with plan to overdose.  She also endorses auditory hallucinations.  Nursing note and vitals reviewed.    ED Treatments / Results  Labs (all labs ordered are listed, but only abnormal results are displayed) Labs Reviewed  CBC - Abnormal; Notable for the following components:      Result Value   Hemoglobin 9.9 (*)    HCT 32.1 (*)    MCV 74.0 (*)    MCH 22.8 (*)    All other components within normal limits  COMPREHENSIVE METABOLIC PANEL - Abnormal; Notable for the following components:   Glucose, Bld 111 (*)    Alkaline Phosphatase 37 (*)    All other components within normal limits  ACETAMINOPHEN LEVEL - Abnormal; Notable for the following components:   Acetaminophen (Tylenol), Serum <10 (*)    All other components within normal limits  RAPID URINE DRUG SCREEN, HOSP PERFORMED - Abnormal; Notable for the following components:   Tetrahydrocannabinol POSITIVE (*)    All other components within normal limits  ETHANOL  SALICYLATE LEVEL  I-STAT BETA HCG BLOOD, ED ([facility], [facility], [facility] ONLY)    EKG None  Radiology No results found.  Procedures Procedures (including critical care time)  Medications Ordered in ED Medications  loratadine (CLARITIN) tablet 10 mg (10 mg Oral Given 03/30/18 1143)  pseudoephedrine (SUDAFED) 12 hr tablet 120 mg (120 mg Oral  Given 03/30/18 1150)  oxymetazoline (AFRIN) 0.05 % nasal spray 1 spray (1 spray Each Nare Given 03/30/18 1144)  loratadine (CLARITIN) tablet 10 mg (has no administration in time range)  risperiDONE (RISPERDAL) tablet 0.5 mg (has no administration in time range)  citalopram (CELEXA) tablet 10 mg (has no administration in time range)     Initial Impression / Assessment and Plan / ED Course  I have reviewed the triage vital signs and the nursing notes.  Pertinent labs & imaging results that were available during my care of the patient were reviewed by me and considered in my medical decision making (see chart for details).     Patient presents to the emergency department for evaluation of SI and sinusitis.  Her sinusitis is likely worsened with chronic hourly Afrin use over the past month.  On exam she has nasal mucosa edema, consistent with siusitis.  No maxillary or frontal sinus tenderness and I do not suspect bacterial rhinosinusitis requiring antibiotics.  Will begin p.o. medications for her symptoms.  Spoke with pharmacy who recommends weaning the afrin with  BID dosing (one nostril AM, other nostril PM.) In terms of her suicidal ideation, I have placed patient in psych hold.  She is voluntary.   Labs including CBC, CMP, acetaminophen, salicylate, ethanol and rapid urine drug screen unremarkable.  She has a microcytic anemia (Hgb 9.9), this is chronic for her. Awaiting TTS consult for further disposition.  She is medically cleared.   TTS counselor Kasandra Knudsen Sprinkel recommends inpatient management of her suicidal ideation.  Final Clinical Impressions(s) / ED Diagnoses   Final diagnoses:  None    ED Discharge Orders    None       Glyn Ade, PA-C 03/30/18 1457    Nat Christen, MD 04/01/18 1109

## 2018-03-30 NOTE — ED Notes (Signed)
Pt has a bed at [facility] 508-2. Report can be called after 2130.
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
## 2018-03-30 NOTE — ED Notes (Signed)
Voluntary consent signed and faxed to [facility]

## 2018-03-30 NOTE — ED Notes (Signed)
Pt ambulatory to [facility] - wearing burgundy scrubs. Sitter w/pt.

## 2018-03-30 NOTE — Progress Notes (Signed)
Pt did not attend wrap-up group   

## 2018-03-30 NOTE — ED Notes (Signed)
Lunch ordered for pt

## 2018-03-30 NOTE — ED Notes (Signed)
staffing office notified of need for sitter; states no sitter available at this time - will notify [facility] and call me back - ED charge nurse aware of same - pt currently in hallway in clear view of staff - pt remains calm, cooperative

## 2018-03-30 NOTE — ED Triage Notes (Signed)
Pt stated at later date somebody needs to do something about theses voices I hear. Im hallucinating

## 2018-03-31 DIAGNOSIS — R45851 Suicidal ideations: Secondary | ICD-10-CM

## 2018-03-31 DIAGNOSIS — G4700 Insomnia, unspecified: Secondary | ICD-10-CM

## 2018-03-31 DIAGNOSIS — F250 Schizoaffective disorder, bipolar type: Principal | ICD-10-CM

## 2018-03-31 MED ORDER — FERROUS SULFATE 325 (65 FE) MG PO TABS
325.0000 mg | ORAL_TABLET | Freq: Every day | ORAL | Status: DC
  Administered 2018-03-31 – 2018-04-03 (×4): 325 mg via ORAL
  Filled 2018-03-31 (×6): qty 1

## 2018-03-31 MED ORDER — PALIPERIDONE ER 6 MG PO TB24
6.0000 mg | ORAL_TABLET | Freq: Every day | ORAL | Status: DC
  Administered 2018-03-31 – 2018-04-03 (×4): 6 mg via ORAL
  Filled 2018-03-31 (×6): qty 1

## 2018-03-31 MED ORDER — PRAZOSIN HCL 1 MG PO CAPS
1.0000 mg | ORAL_CAPSULE | Freq: Every day | ORAL | Status: DC
  Administered 2018-03-31: 1 mg via ORAL
  Filled 2018-03-31 (×3): qty 1

## 2018-03-31 NOTE — Progress Notes (Signed)
Recreation Therapy Notes  INPATIENT RECREATION THERAPY ASSESSMENT  Patient Details Name: Angela Conner MRN: [id number] DOB: 12/02/1970 Today's Date: 03/31/2018       Information Obtained From: Patient  Able to Participate in Assessment/Interview: Yes  Patient Presentation: Oriented, Alert  Reason for Admission (Per Patient): Suicidal Ideation, Other (Comments)(Hearing voices)  Patient Stressors: Family, Work(Pt stated helping her mother, worrying about her children and not having a job)  Radiographer, therapeutic:   Building control surveyor, Child psychotherapist, Sports, TV, Music, Exercise, Talk, Prayer, Avoidance, Read, Hot Bath/Shower  Leisure Interests (2+):  Exercise - Walking, Music - Listen  Frequency of Recreation/Participation: Other (Comment)(Daily)  Awareness of Community Resources:  Yes  Community Resources:  Engineer, drilling, [place]  Current Use: Yes  Expressed Interest in [place] of Residence:  [place]  Patient Main Form of Transportation: Diplomatic Services operational officer  Patient Strengths:  Talk self in and out of things  Patient Identified Areas of Improvement:  Patience  Patient Goal for Hospitalization:  "Get help with the voices"  Current SI (including self-harm):  No  Current HI:  No  Current AVH: No  Staff Intervention Plan: Group Attendance, Collaborate with Interdisciplinary Treatment Team  Consent to Intern Participation: N/A    Victorino Sparrow, LRT/CTRS  Ria Comment, [place] 03/31/2018, 1:51 PM

## 2018-03-31 NOTE — BHH Group Notes (Signed)
LCSW Group Therapy Note   03/31/2018 1:15pm   Type of Therapy and Topic:  Group Therapy:  Positive Affirmations   Participation Level:  None  Description of Group: This group addressed positive affirmation toward self and others. Patients went around the room and identified two positive things about themselves and two positive things about a peer in the room. Patients reflected on how it felt to share something positive with others, to identify positive things about themselves, and to hear positive things from others. Patients were encouraged to have a daily reflection of positive characteristics or circumstances.  Therapeutic Goals 1. Patient will verbalize two of their positive qualities 2. Patient will demonstrate empathy for others by stating two positive qualities about a peer in the group 3. Patient will verbalize their feelings when voicing positive self affirmations and when voicing positive affirmations of others 4. Patients will discuss the potential positive impact on their wellness/recovery of focusing on positive traits of self and others. Summary of Patient Progress:  Came initially, engaged throughout.  Called out to see Dr and did not return.  Therapeutic Modalities Cognitive Behavioral Therapy Motivational Interviewing  Trish Mage, [place] 03/31/2018 1:55 PM

## 2018-03-31 NOTE — Tx Team (Signed)
Initial Treatment Plan 03/31/2018 6:15 AM Angela Conner HCS:[id number]    PATIENT STRESSORS: Financial difficulties Health problems Medication change or noncompliance   PATIENT STRENGTHS: Ability for insight Active sense of humor Capable of independent living General fund of knowledge   PATIENT IDENTIFIED PROBLEMS: "hearing voices"  "anxiety"  "suicidal voices that turn into suicidal thoughts"                 DISCHARGE CRITERIA:  Ability to meet basic life and health needs Adequate post-discharge living arrangements Improved stabilization in mood, thinking, and/or behavior  PRELIMINARY DISCHARGE PLAN: Attend aftercare/continuing care group Placement in alternative living arrangements  PATIENT/FAMILY INVOLVEMENT: This treatment plan has been presented to and reviewed with the patient, Angela Conner.  The patient and family have been given the opportunity to ask questions and make suggestions.  Gwendolyn Fill, RN 03/31/2018, 6:15 AM
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
## 2018-03-31 NOTE — BHH Suicide Risk Assessment (Signed)
[facility] Admission Suicide Risk Assessment   Nursing information obtained from:  Patient Demographic factors:  Low socioeconomic status, Living alone Current Mental Status:  NA Loss Factors:  NA Historical Factors:  Prior suicide attempts Risk Reduction Factors:  Positive social support, Positive therapeutic relationship, Positive coping skills or problem solving skills  Total Time spent with patient: 1 hour Principal Problem: Schizoaffective disorder, bipolar type ([place]) Diagnosis:   Patient Active Problem List   Diagnosis Date Noted  . Schizoaffective disorder, bipolar type ([place]) [F25.0] 03/30/2018   Subjective Data: See H&P for details  Continued Clinical Symptoms:  Alcohol Use Disorder Identification Test Final Score (AUDIT): 0 The "Alcohol Use Disorders Identification Test", Guidelines for Use in Primary Care, Second Edition.  World Pharmacologist [facility]). Score between 0-7:  no or low risk or alcohol related problems. Score between 8-15:  moderate risk of alcohol related problems. Score between 16-19:  high risk of alcohol related problems. Score 20 or above:  warrants further diagnostic evaluation for alcohol dependence and treatment.   CLINICAL FACTORS:   Bipolar Disorder:   Depressive phase Schizophrenia:   Command hallucinatons Paranoid or undifferentiated type    Psychiatric Specialty Exam: Physical Exam  Nursing note and vitals reviewed.   ROS- See H&P for details  Blood pressure 116/85, pulse (!) 138, temperature 98.4 F (36.9 C), temperature source Oral, resp. rate 16, height 5\' 7"  (1.702 m), weight 91.6 kg (202 lb), last menstrual period 03/09/2018, SpO2 100 %.Body mass index is 31.64 kg/m.   COGNITIVE FEATURES THAT CONTRIBUTE TO RISK:  None    SUICIDE RISK:   Moderate:  Frequent suicidal ideation with limited intensity, and duration, some specificity in terms of plans, no associated intent, good self-control, limited dysphoria/symptomatology, some risk  factors present, and identifiable protective factors, including available and accessible social support.  PLAN OF CARE: See H&P for details  I certify that inpatient services furnished can reasonably be expected to improve the patient's condition.   Pennelope Bracken, MD 03/31/2018, 4:47 PM

## 2018-03-31 NOTE — Progress Notes (Signed)
Adult Psychoeducational Group Note  Date:  03/31/2018 Time:  9:10 PM  Group Topic/Focus:  Wrap-Up Group:   The focus of this group is to help patients review their daily goal of treatment and discuss progress on daily workbooks.  Participation Level:  Active  Participation Quality:  Appropriate  Affect:  Appropriate  Cognitive:  Appropriate  Insight: Appropriate  Engagement in Group:  Engaged  Modes of Intervention:  Discussion  Additional Comments:The patient expressed that she rates today a 9 and attended all groups.The patient also said that her goal is to work on having a better day.  Angela Conner 03/31/2018, 9:10 PM

## 2018-03-31 NOTE — Progress Notes (Signed)
Recreation Therapy Notes  Date: 8.6.19 Time: 1000 Location: 500 Hall Dayroom  Group Topic: Communication, Team Building, Problem Solving  Goal Area(s) Addresses:  Patient will effectively work with peer towards shared goal.  Patient will identify skill used to make activity successful.  Patient will identify how skills used during activity can be used to reach post d/c goals.   Behavioral Response: Engaged  Intervention: STEM Activity   Activity: In team's, using 20 plastic straws and 24 inches of masking tape, patients were asked to build an elevated bridge that would hold a light weight paperback book.    Education: Education officer, community, Dentist.   Education Outcome: Acknowledges education/In group clarification offered/Needs additional education.   Clinical Observations/Feedback: Pt was attentive and active during activity.  Pt worked well with her peers.  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      Victorino Sparrow, LRT/CTRS     Victorino Sparrow A 03/31/2018 11:33 AM

## 2018-03-31 NOTE — Plan of Care (Signed)
  Problem: Activity: Goal: Interest or engagement in activities will improve Outcome: Progressing   Problem: Safety: Goal: Periods of time without injury will increase 03/31/2018 1834 by Vela Prose, RN Outcome: Progressing 03/31/2018 1700 by Vela Prose, RN Outcome: Progressing  DAR NOTE: Patient presents with anxious affect and mood.  Denies suicidal thoughts but reports auditory and visual hallucinations.  States she only hears voices and suicidal thoughts when she is alone in a quiet place.  Rates depression at 0, hopelessness at 0, and anxiety at 3.  Maintained on routine safety checks.  Medications given as prescribed.  Support and encouragement offered as needed.  Attended group and participated.  States goal for today is "to get better and live voice free."  Patient visible in milieu interacting with peers.  Patient is safe on and off the unit.

## 2018-04-01 MED ORDER — BIOTENE DRY MOUTH MT LIQD
15.0000 mL | OROMUCOSAL | Status: DC | PRN
  Administered 2018-04-02 – 2018-04-03 (×2): 15 mL via OROMUCOSAL
  Filled 2018-04-01: qty 15

## 2018-04-01 MED ORDER — OXYMETAZOLINE HCL 0.05 % NA SOLN
1.0000 | Freq: Two times a day (BID) | NASAL | Status: AC
  Administered 2018-04-01: 1 via NASAL
  Filled 2018-04-01: qty 15

## 2018-04-01 MED ORDER — FLUTICASONE PROPIONATE 50 MCG/ACT NA SUSP
2.0000 | Freq: Every day | NASAL | Status: DC
  Administered 2018-04-01 – 2018-04-03 (×3): 2 via NASAL
  Filled 2018-04-01 (×2): qty 16

## 2018-04-01 MED ORDER — PRAZOSIN HCL 2 MG PO CAPS
2.0000 mg | ORAL_CAPSULE | Freq: Every day | ORAL | Status: DC
  Administered 2018-04-01 – 2018-04-02 (×2): 2 mg via ORAL
  Filled 2018-04-01 (×3): qty 1
  Filled 2018-04-01: qty 2
  Filled 2018-04-01: qty 1

## 2018-04-01 MED ORDER — SALINE SPRAY 0.65 % NA SOLN
1.0000 | NASAL | Status: DC | PRN
  Administered 2018-04-01 – 2018-04-02 (×3): 1 via NASAL
  Filled 2018-04-01: qty 44

## 2018-04-01 NOTE — Progress Notes (Signed)
[facility] MD Progress Note  04/01/2018 3:33 PM Angela Conner  MRN:  [id number] Subjective:    Angela Conner is a 47 y/o F with history of schizophrenia, Bipolar I, and PTSD who was admitted voluntarily from [facility] where she presented with worsening sinus congestion, CAH to kill herself, and worsening depression. Pt was medically cleared and then transferred to [facility] for additional treatment and stabilization. Pt was started on trial of prazosin for nightmares and invega for AH.   Today upon evaluation, pt shares, "I'm doing good, but I still have this congestion." Pt reports that overall she is doing well in terms of her psychotic symptoms. She denies SI/HI/AH/VH. She is sleeping well but she notes that she still had some disturbing dreams last night. She denies physical complaints aside from sinus congestion. She is tolerating her medication well and she would like to continue it without changes. She is no longer interested in the long-acting injectable form of [place], but she would like to stay on the oral form. She plans to stay with her daughter after discharge. Pt agrees to increase dose of prazosin tonight and to continue her other medications without changes.    Principal Problem: Schizoaffective disorder, bipolar type ([place]) Diagnosis:   Patient Active Problem List   Diagnosis Date Noted  . Schizoaffective disorder, bipolar type ([place]) [F25.0] 03/30/2018   Total Time spent with patient: 30 minutes  Past Psychiatric History: see H&P  Past Medical History:  Past Medical History:  Diagnosis Date  . Bipolar 1 disorder ([place])   . Hypercholesteremia   . Hypertension   . Schizophrenia ([place])    History reviewed. No pertinent surgical history. Family History: History reviewed. No pertinent family history. Family Psychiatric  History: see H&P Social History:  Social History   Substance and Sexual Activity  Alcohol Use No     Social History   Substance and Sexual Activity  Drug  Use Yes  . Types: Marijuana    Social History   Socioeconomic History  . Marital status: Single    Spouse name: Not on file  . Number of children: Not on file  . Years of education: Not on file  . Highest education level: Not on file  Occupational History  . Not on file  Social Needs  . Financial resource strain: Not on file  . Food insecurity:    Worry: Not on file    Inability: Not on file  . Transportation needs:    Medical: Not on file    Non-medical: Not on file  Tobacco Use  . Smoking status: Former Research scientist (life sciences)  . Smokeless tobacco: Never Used  Substance and Sexual Activity  . Alcohol use: No  . Drug use: Yes    Types: Marijuana  . Sexual activity: Not on file  Lifestyle  . Physical activity:    Days per week: Not on file    Minutes per session: Not on file  . Stress: Not on file  Relationships  . Social connections:    Talks on phone: Not on file    Gets together: Not on file    Attends religious service: Not on file    Active member of club or organization: Not on file    Attends meetings of clubs or organizations: Not on file    Relationship status: Not on file  Other Topics Concern  . Not on file  Social History Narrative  . Not on file   Additional Social History:  Sleep: Good  Appetite:  Good  Current Medications: Current Facility-Administered Medications  Medication Dose Route Frequency Provider Last Rate Last Dose  . alum & mag hydroxide-simeth (MAALOX/MYLANTA) 200-200-20 MG/5ML suspension 30 mL  30 mL Oral Q4H PRN Elmarie Shiley A, NP      . antiseptic oral rinse (BIOTENE) solution 15 mL  15 mL Mouth Rinse PRN Pennelope Bracken, MD      . citalopram (CELEXA) tablet 10 mg  10 mg Oral Daily Elmarie Shiley A, NP   10 mg at 04/01/18 0804  . ferrous sulfate tablet 325 mg  325 mg Oral Q breakfast Pennelope Bracken, MD   325 mg at 04/01/18 0803  . loratadine (CLARITIN) tablet 10 mg  10 mg Oral Daily Elmarie Shiley A, NP   10 mg at 04/01/18 0803  . magnesium hydroxide (MILK OF MAGNESIA) suspension 30 mL  30 mL Oral Daily PRN Elmarie Shiley A, NP      . oxymetazoline (AFRIN) 0.05 % nasal spray 1 spray  1 spray Each Nare BID Pennelope Bracken, MD      . paliperidone (INVEGA) 24 hr tablet 6 mg  6 mg Oral Daily Pennelope Bracken, MD   6 mg at 04/01/18 0803  . prazosin (MINIPRESS) capsule 2 mg  2 mg Oral QHS Pennelope Bracken, MD      . pseudoephedrine (SUDAFED) 12 hr tablet 120 mg  120 mg Oral BID Elmarie Shiley A, NP   120 mg at 04/01/18 0803  . traZODone (DESYREL) tablet 50 mg  50 mg Oral QHS PRN Niel Hummer, NP   50 mg at 03/31/18 2122    Lab Results: No results found for this or any previous visit (from the past 48 hour(s)).  Blood Alcohol level:  Lab Results  Component Value Date   ETH <10 03/30/2018   ETH <11 56/97/9480    Metabolic Disorder Labs: No results found for: HGBA1C, MPG No results found for: PROLACTIN Lab Results  Component Value Date   CHOL 200 12/26/2009   TRIG 147 12/26/2009   HDL 34 (L) 12/26/2009   CHOLHDL 5.9 Ratio 12/26/2009   VLDL 29 12/26/2009   LDLCALC 137 (H) 12/26/2009   LDLCALC 110 (H) 11/10/2007    Physical Findings: AIMS:  , ,  ,  ,    CIWA:    COWS:     Musculoskeletal: Strength & Muscle Tone: within normal limits Gait & Station: normal Patient leans: N/A  Psychiatric Specialty Exam: Physical Exam  Nursing note and vitals reviewed.   Review of Systems  Constitutional: Negative for chills and fever.  HENT: Positive for congestion.   Respiratory: Negative for cough and shortness of breath.   Cardiovascular: Negative for chest pain.  Gastrointestinal: Negative for abdominal pain, heartburn, nausea and vomiting.  Psychiatric/Behavioral: Negative for depression, hallucinations and suicidal ideas. The patient is not nervous/anxious and does not have insomnia.     Blood pressure 111/81, pulse (!) 146, temperature 97.9 F (36.6  C), temperature source Oral, resp. rate 17, height 5\' 7"  (1.702 m), weight 91.6 kg (202 lb), last menstrual period 03/09/2018, SpO2 100 %.Body mass index is 31.64 kg/m.  General Appearance: Casual and Fairly Groomed  Eye Contact:  Good  Speech:  Clear and Coherent and Normal Rate  Volume:  Normal  Mood:  Euthymic  Affect:  Appropriate and Congruent  Thought Process:  Coherent and Goal Directed  Orientation:  Full (Time, Place, and Person)  Thought Content:  Logical  Suicidal Thoughts:  No  Homicidal Thoughts:  No  Memory:  Immediate;   Fair Recent;   Fair Remote;   Fair  Judgement:  Fair  Insight:  Fair  Psychomotor Activity:  Normal  Concentration:  Concentration: Fair  Recall:  [employer] of Knowledge:  Fair  Language:  Fair  Akathisia:  No  Handed:    AIMS (if indicated):     Assets:  Resilience Social Support  ADL's:  Intact  Cognition:  WNL  Sleep:  Number of Hours: 6.75   Treatment Plan Summary: Daily contact with patient to assess and evaluate symptoms and progress in treatment and Medication management   -Continue inpatient hospitalization  -Schizoaffective disorder, bipolar type    -Continue invega 6mg  po qDay   - Continue celexa 10mg  po qDay  -microcytic anemia   -Continue ferrous sulfate 325mg  po qAM with breakfast  -Nightmares associated with previous trauma   -Change prazosin 1mg  po qhs to prazosin 2mg  po qhs  -seasonal allergy/rhinitis     -Continue claritin 10mg  po qDay   -Continue sudafed 120mg  po BID    -Continue afrin 0.05% 1 spay each nare BID  -insomnia   -Continue trazodone 50mg  po qhs prn insomnia  -Encourage participation in groups and therapeutic milieu  -disposition planning will be ongoing  Pennelope Bracken, MD 04/01/2018, 3:33 PM
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
## 2018-04-01 NOTE — BHH Group Notes (Signed)
LCSW Group Therapy Note  04/01/2018 1:15pm  Type of Therapy/Topic:  Group Therapy:  Emotion Regulation  Participation Level:  Active   Description of Group:   The purpose of this group is to assist patients in learning to regulate negative emotions and experience positive emotions. Patients will be guided to discuss ways in which they have been vulnerable to their negative emotions. These vulnerabilities will be juxtaposed with experiences of positive emotions or situations, and patients will be challenged to use positive emotions to combat negative ones. Special emphasis will be placed on coping with negative emotions in conflict situations, and patients will process healthy conflict resolution skills.  Therapeutic Goals: 1. Patient will identify two positive emotions or experiences to reflect on in order to balance out negative emotions 2. Patient will label two or more emotions that they find the most difficult to experience 3. Patient will demonstrate positive conflict resolution skills through discussion and/or role plays  Summary of Patient Progress:  Stayed the entire time, engaged throughout.  "I've been feeling annoyed-I don't really know why.  But I'm working on being more positive.  I want to get to the place that I feel happy. That's how I feel when I talk to my daughter.  I might be going to stay in [place] with her for awhile."     Therapeutic Modalities:   Cognitive Behavioral Therapy Feelings Identification Dialectical Behavioral Therapy   Trish Mage, LCSW 04/01/2018 11:27 AM

## 2018-04-01 NOTE — BHH Suicide Risk Assessment (Signed)
[place] INPATIENT:  Family/Significant Other Suicide Prevention Education  Suicide Prevention Education:  Education Completed; Art Buff, [phone] has been identified by the patient as the family member/significant other with whom the patient will be residing, and identified as the person(s) who will aid the patient in the event of a mental health crisis (suicidal ideations/suicide attempt).  With written consent from the patient, the family member/significant other has been provided the following suicide prevention education, prior to the and/or following the discharge of the patient.  The suicide prevention education provided includes the following:  Suicide risk factors  Suicide prevention and interventions  National Suicide Hotline telephone number  [facility] assessment telephone number  [facility] Emergency Assistance [place] and/or Residential Mobile Crisis Unit telephone number  Request made of family/significant other to:  Remove weapons (e.g., guns, rifles, knives), all items previously/currently identified as safety concern.    Remove drugs/medications (over-the-counter, prescriptions, illicit drugs), all items previously/currently identified as a safety concern.  The family member/significant other verbalizes understanding of the suicide prevention education information provided.  The family member/significant other agrees to remove the items of safety concern listed above.   [place] 04/01/2018, 4:02 PM

## 2018-04-01 NOTE — Progress Notes (Signed)
Recreation Therapy Notes  Date: 8.7.19 Time: 1000 Location: 500 Hall Dayroom  Group Topic: Coping Skills  Goal Area(s) Addresses:  Patient will be able to identify positive coping skills. Patient will be able to identify benefits of coping skills. Patient will identify benefits of using coping skills post d/c.  Behavioral Response: Engaged  Intervention: Worksheet, magazines, scissors, glue sticks, Architect paper  Activity: Radiographer, therapeutic.  Patients were to use the magazines to find pictures that represent coping skills for diversions, social, cognitive, tension releasers and physical.   Education: Coping Skills, Discharge Planning.   Education Outcome: Acknowledges understanding/In group clarification offered/Needs additional education.   Clinical Observations/Feedback:  Pt was bright and social during group.  Pt stated her coping skills were diversions- pretty women, sports; social- eating, traveling; cognitive- grand kids, reading; tension releaser- music; and physical-walking, eating healthy.    Victorino Sparrow, LRT/CTRS         Ria Comment, Kail Fraley A 04/01/2018 11:30 AM

## 2018-04-01 NOTE — BHH Group Notes (Signed)
[place] Group Notes:  (Nursing/MHT/Case Management/Adjunct)  Date:  04/01/2018  Time:  5:35 PM  Type of Therapy:  Psychoeducational Skills  Participation Level:  Active  Participation Quality:  Appropriate  Affect:  Appropriate  Cognitive:  Appropriate  Insight:  Appropriate  Engagement in Group:  Engaged  Modes of Intervention:  Discussion  Summary of Progress/Problems:  Patient attended group and was appropriate and alert.    Cammy Copa 04/01/2018, 5:35 PM

## 2018-04-01 NOTE — Tx Team (Signed)
Interdisciplinary Treatment and Diagnostic Plan Update  04/01/2018 Time of Session: 8:14 AM  Angela Conner MRN: [id number]  Principal Diagnosis: Schizoaffective disorder, bipolar type [facility])  Secondary Diagnoses: Principal Problem:   Schizoaffective disorder, bipolar type ([place])   Current Medications:  Current Facility-Administered Medications  Medication Dose Route Frequency Provider Last Rate Last Dose  . alum & mag hydroxide-simeth (MAALOX/MYLANTA) 200-200-20 MG/5ML suspension 30 mL  30 mL Oral Q4H PRN Niel Hummer, NP      . citalopram (CELEXA) tablet 10 mg  10 mg Oral Daily Elmarie Shiley A, NP   10 mg at 04/01/18 0804  . ferrous sulfate tablet 325 mg  325 mg Oral Q breakfast Pennelope Bracken, MD   325 mg at 04/01/18 0803  . loratadine (CLARITIN) tablet 10 mg  10 mg Oral Daily Elmarie Shiley A, NP   10 mg at 04/01/18 0803  . magnesium hydroxide (MILK OF MAGNESIA) suspension 30 mL  30 mL Oral Daily PRN Elmarie Shiley A, NP      . paliperidone (INVEGA) 24 hr tablet 6 mg  6 mg Oral Daily Pennelope Bracken, MD   6 mg at 04/01/18 0803  . prazosin (MINIPRESS) capsule 1 mg  1 mg Oral QHS Pennelope Bracken, MD   1 mg at 03/31/18 2122  . pseudoephedrine (SUDAFED) 12 hr tablet 120 mg  120 mg Oral BID Elmarie Shiley A, NP   120 mg at 04/01/18 0803  . traZODone (DESYREL) tablet 50 mg  50 mg Oral QHS PRN Niel Hummer, NP   50 mg at 03/31/18 2122    PTA Medications: Medications Prior to Admission  Medication Sig Dispense Refill Last Dose  . ARIPiprazole (ABILIFY) 15 MG tablet Take 15 mg by mouth daily.   week ago  . hydrOXYzine (ATARAX/VISTARIL) 25 MG tablet Take 1 tablet (25 mg total) by mouth every 6 (six) hours. (Patient taking differently: Take 25 mg by mouth at bedtime as needed (sleep). ) 12 tablet 0 few days ago  . losartan-hydrochlorothiazide (HYZAAR) 100-25 MG tablet Take 1 tablet by mouth daily.   1-2 weeks ago  . naproxen sodium (ALEVE) 220 MG tablet Take 220 mg by  mouth 2 (two) times daily as needed (pain/headache).   couple months ago  . oxymetazoline (AFRIN) 0.05 % nasal spray Place 1 spray into both nostrils every hour as needed for congestion.   03/30/2018 at am  . sodium chloride (OCEAN) 0.65 % SOLN nasal spray Place 1 spray into both nostrils as needed for congestion.   03/29/2018 at pm  . triamcinolone cream (KENALOG) 0.1 % Apply 1 application topically 2 (two) times daily. (Patient not taking: Reported on 03/30/2018) 30 g 0 Not Taking at Unknown time    Patient Stressors: Financial difficulties Health problems Medication change or noncompliance  Patient Strengths: Ability for insight Active sense of humor Capable of independent living General fund of knowledge  Treatment Modalities: Medication Management, Group therapy, Case management,  1 to 1 session with clinician, Psychoeducation, Recreational therapy.   Physician Treatment Plan for Primary Diagnosis: Schizoaffective disorder, bipolar type ([place]) Long Term Goal(s): Improvement in symptoms so as ready for discharge  Short Term Goals: Ability to identify and develop effective coping behaviors will improve Ability to maintain clinical measurements within normal limits will improve  Medication Management: Evaluate patient's response, side effects, and tolerance of medication regimen.  Therapeutic Interventions: 1 to 1 sessions, Unit Group sessions and Medication administration.  Evaluation of Outcomes: Progressing  Physician  Treatment Plan for Secondary Diagnosis: Principal Problem:   Schizoaffective disorder, bipolar type ([place])   Long Term Goal(s): Improvement in symptoms so as ready for discharge  Short Term Goals: Ability to identify and develop effective coping behaviors will improve Ability to maintain clinical measurements within normal limits will improve  Medication Management: Evaluate patient's response, side effects, and tolerance of medication regimen.  Therapeutic  Interventions: 1 to 1 sessions, Unit Group sessions and Medication administration.  Evaluation of Outcomes: Progressing   RN Treatment Plan for Primary Diagnosis: Schizoaffective disorder, bipolar type ([place]) Long Term Goal(s): Knowledge of disease and therapeutic regimen to maintain health will improve  Short Term Goals: Ability to identify and develop effective coping behaviors will improve and Compliance with prescribed medications will improve  Medication Management: RN will administer medications as ordered by provider, will assess and evaluate patient's response and provide education to patient for prescribed medication. RN will report any adverse and/or side effects to prescribing provider.  Therapeutic Interventions: 1 on 1 counseling sessions, Psychoeducation, Medication administration, Evaluate responses to treatment, Monitor vital signs and CBGs as ordered, Perform/monitor CIWA, COWS, AIMS and Fall Risk screenings as ordered, Perform wound care treatments as ordered.  Evaluation of Outcomes: Progressing   LCSW Treatment Plan for Primary Diagnosis: Schizoaffective disorder, bipolar type ([place]) Long Term Goal(s): Safe transition to appropriate next level of care at discharge, Engage patient in therapeutic group addressing interpersonal concerns.  Short Term Goals: Engage patient in aftercare planning with referrals and resources  Therapeutic Interventions: Assess for all discharge needs, 1 to 1 time with Social worker, Explore available resources and support systems, Assess for adequacy in community support network, Educate family and significant other(s) on suicide prevention, Complete Psychosocial Assessment, Interpersonal group therapy.  Evaluation of Outcomes: Met   Progress in Treatment: Attending groups: Yes Participating in groups: Yes Taking medication as prescribed: Yes Toleration medication: Yes, no side effects reported at this time Family/Significant other contact  made: No Patient understands diagnosis: Yes AEB asking for help with command hallucinations Discussing patient identified problems/goals with staff: Yes Medical problems stabilized or resolved: Yes Denies suicidal/homicidal ideation: Yes Issues/concerns per patient self-inventory: None Other: N/A  New problem(s) identified: None identified at this time.   New Short Term/Long Term Goal(s): "I want to get the medicine working right so I can maintain on the outside."   Discharge Plan or Barriers:   Reason for Continuation of Hospitalization:  Depression Hallucinations  Medical Issues Medication stabilization   Estimated Length of Stay: 8/12  Attendees: Patient: Angela Conner 04/01/2018  8:14 AM  Physician: Maris Berger, MD 04/01/2018  8:14 AM  Nursing: Sena Hitch, RN 04/01/2018  8:14 AM  RN Care Manager: Lars Pinks, RN 04/01/2018  8:14 AM  Social Worker: Ripley Fraise 04/01/2018  8:14 AM  Recreational Therapist: Winfield Cunas 04/01/2018  8:14 AM  Other: Norberto Sorenson 04/01/2018  8:14 AM  Other:  04/01/2018  8:14 AM    Scribe for Treatment Team:  Roque Lias LCSW 04/01/2018 8:14 AM

## 2018-04-01 NOTE — Progress Notes (Signed)
Nursing Progress Note: 7p-7a D: Pt currently presents with a pleasant/anxious affect and behavior. Pt states "I need some new meds for my sinuses. Sudafed and Afrin are not helping." Interacting appropriately with the milieu. Pt reports good sleep during the previous night with current medication regimen. Pt did attend wrap-up group.  A: Pt provided with medications per providers orders. Pt's labs and vitals were monitored throughout the night. Pt supported emotionally and encouraged to express concerns and questions. Pt educated on medications.  R: Pt's safety ensured with 15 minute and environmental checks. Pt currently denies SI, HI, and AVH. Pt verbally contracts to seek staff if SI,HI, or AVH occurs and to consult with staff before acting on any harmful thoughts. Will continue to monitor.

## 2018-04-02 MED ORDER — TRIAMTERENE-HCTZ 37.5-25 MG PO TABS
1.0000 | ORAL_TABLET | Freq: Every day | ORAL | Status: DC
  Administered 2018-04-02 – 2018-04-03 (×2): 1 via ORAL
  Filled 2018-04-02 (×5): qty 1

## 2018-04-02 MED ORDER — TRIAMTERENE-HCTZ 37.5-25 MG PO TABS: 1.0000 | ORAL_TABLET | Freq: Every day | ORAL | Status: DC

## 2018-04-02 NOTE — Progress Notes (Signed)
D: Dru has been pleasant and appropriate today. She denied SI, HI, and AVH today. Her main concern is her nasal congestion. On her self inventory form, she rated her depression, hopelessness, and anxiety all zero/10. She reported fair sleep, good appetite, normal energy level, and good concentration.   A: Meds given as ordered. Q15 safety checks maintained. Support/encouragement offered.  R: Pt remains free from harm and continues with treatment. Will continue to monitor for needs/safety.

## 2018-04-02 NOTE — Progress Notes (Signed)
Recreation Therapy Notes  Date: 8.8.19 Time: 0950 Location: 500 Hall Dayroom  Group Topic:  Goal Setting  Goal Area(s) Addresses:  Patient will be able to identify at least 3 life goals.  Patient will be able to identify benefit of investing in life goals.  Patient will be able to identify benefit of setting life goals post d/c.   Intervention: Worksheet, pencils  Activity: Life Goals.  Patients were given a work sheet with six categories (family, friends, work/school, spirituality, body and mental health).  Patients were to identify what they were doing well, what they needed to improve and write a goal to help them make the improvement.  Patients would then share their top 3 categories with the group.  Education: Discharge Planning, Radiographer, therapeutic, Leisure Education  Education Outcome: Acknowledges Education/In Group Clarification Provided/Needs Additional Education  Clinical Observations: Pt did not attend group.    Victorino Sparrow, LRT/CTRS    Victorino Sparrow A 04/02/2018 11:41 AM

## 2018-04-02 NOTE — Progress Notes (Signed)
[facility] MD Progress Note  04/02/2018 3:14 PM FRANKLIN CLAPSADDLE  MRN:  [id number]  Subjective: Aliene reports, "I came to the hospital because I was very congested. Then, the voices in my head were telling me to go ahead & kill myself because I could not breath anyway. But, today, other than the congestion going on, I have not heard any voices. My mood is good. I have no anxiety. I slept a little bit last night because my nose is stuffed up. I will need to be discharged soon please. My daughter needs me to help with my grandson. I cannot be here till Monday because I have a job offer waiting for me at [employer]".    Laisha Rau is a 47 y/o F with history of schizophrenia, Bipolar I, and PTSD who [facility] where she presented with worsening sinus congestion, CAH to kill herself, and worsening depression. Pt was medically cleared and then transferred to [facility] for additional treatment and stabilization. Pt was started on trial of prazosin for nightmares and invega for AH.   Today upon evaluation, pt shares, "I'm doing good, but I still have this congestion." Pt reports that overall she is doing well in terms of her psychotic symptoms. She denies SI/HI/AH/VH. She is sleeping well but she notes that she still had some congestion going on. She denies any physical complaints aside from sinus congestion. She is tolerating her medication well and she would like to continue it without changes. She is no longer interested in the long-acting injectable form of [place], but she would like to stay on the oral form. She plans to stay with her daughter after discharge. Pt agreed to increase her dose of prazosin  last night and to continue her other medications without changes. She would like to be discharged to go home to help her daughter. She also says, she has a job offer waiting for her at [employer].  Principal Problem: Schizoaffective disorder, bipolar type ([place])  Diagnosis:    Patient Active Problem List   Diagnosis Date Noted  . Schizoaffective disorder, bipolar type ([place]) [F25.0] 03/30/2018   Total Time spent with patient: 15 minutes  Past Psychiatric History: See H&P  Past Medical History:  Past Medical History:  Diagnosis Date  . Bipolar 1 disorder ([place])   . Hypercholesteremia   . Hypertension   . Schizophrenia ([place])    History reviewed. No pertinent surgical history.  Family History: History reviewed. No pertinent family history.  Family Psychiatric  History: See H&P  Social History:  Social History   Substance and Sexual Activity  Alcohol Use No     Social History   Substance and Sexual Activity  Drug Use Yes  . Types: Marijuana    Social History   Socioeconomic History  . Marital status: Single    Spouse name: Not on file  . Number of children: Not on file  . Years of education: Not on file  . Highest education level: Not on file  Occupational History  . Not on file  Social Needs  . Financial resource strain: Not on file  . Food insecurity:    Worry: Not on file    Inability: Not on file  . Transportation needs:    Medical: Not on file    Non-medical: Not on file  Tobacco Use  . Smoking status: Former Research scientist (life sciences)  . Smokeless tobacco: Never Used  Substance and Sexual Activity  . Alcohol use: No  . Drug use:  Yes    Types: Marijuana  . Sexual activity: Not on file  Lifestyle  . Physical activity:    Days per week: Not on file    Minutes per session: Not on file  . Stress: Not on file  Relationships  . Social connections:    Talks on phone: Not on file    Gets together: Not on file    Attends religious service: Not on file    Active member of club or organization: Not on file    Attends meetings of clubs or organizations: Not on file    Relationship status: Not on file  Other Topics Concern  . Not on file  Social History Narrative  . Not on file   Additional Social History:   Sleep: Good  Appetite:   Good  Current Medications: Current Facility-Administered Medications  Medication Dose Route Frequency Provider Last Rate Last Dose  . alum & mag hydroxide-simeth (MAALOX/MYLANTA) 200-200-20 MG/5ML suspension 30 mL  30 mL Oral Q4H PRN Elmarie Shiley A, NP      . antiseptic oral rinse (BIOTENE) solution 15 mL  15 mL Mouth Rinse PRN Pennelope Bracken, MD   15 mL at 04/02/18 0834  . citalopram (CELEXA) tablet 10 mg  10 mg Oral Daily Elmarie Shiley A, NP   10 mg at 04/02/18 0831  . ferrous sulfate tablet 325 mg  325 mg Oral Q breakfast Pennelope Bracken, MD   325 mg at 04/02/18 0831  . fluticasone (FLONASE) 50 MCG/ACT nasal spray 2 spray  2 spray Each Nare Daily Laverle Hobby, PA-C   2 spray at 04/02/18 4098  . loratadine (CLARITIN) tablet 10 mg  10 mg Oral Daily Elmarie Shiley A, NP   10 mg at 04/02/18 0831  . magnesium hydroxide (MILK OF MAGNESIA) suspension 30 mL  30 mL Oral Daily PRN Elmarie Shiley A, NP      . oxymetazoline (AFRIN) 0.05 % nasal spray 1 spray  1 spray Each Nare BID Pennelope Bracken, MD   1 spray at 04/01/18 1825  . paliperidone (INVEGA) 24 hr tablet 6 mg  6 mg Oral Daily Pennelope Bracken, MD   6 mg at 04/02/18 1191  . prazosin (MINIPRESS) capsule 2 mg  2 mg Oral QHS Pennelope Bracken, MD   2 mg at 04/01/18 2208  . pseudoephedrine (SUDAFED) 12 hr tablet 120 mg  120 mg Oral BID Elmarie Shiley A, NP   120 mg at 04/02/18 0831  . sodium chloride (OCEAN) 0.65 % nasal spray 1 spray  1 spray Each Nare PRN Laverle Hobby, PA-C   1 spray at 04/01/18 2208  . traZODone (DESYREL) tablet 50 mg  50 mg Oral QHS PRN Niel Hummer, NP   50 mg at 04/01/18 2208   Lab Results: No results found for this or any previous visit (from the past 32 hour(s)).  Blood Alcohol level:  Lab Results  Component Value Date   ETH <10 03/30/2018   ETH <11 47/82/9562    Metabolic Disorder Labs: No results found for: HGBA1C, MPG No results found for: PROLACTIN Lab Results   Component Value Date   CHOL 200 12/26/2009   TRIG 147 12/26/2009   HDL 34 (L) 12/26/2009   CHOLHDL 5.9 Ratio 12/26/2009   VLDL 29 12/26/2009   LDLCALC 137 (H) 12/26/2009   LDLCALC 110 (H) 11/10/2007   Physical Findings: AIMS:  , ,  ,  ,    CIWA:  COWS:     Musculoskeletal: Strength & Muscle Tone: within normal limits Gait & Station: normal Patient leans: N/A  Psychiatric Specialty Exam: Physical Exam  Nursing note and vitals reviewed.   Review of Systems  Constitutional: Negative for chills and fever.  HENT: Positive for congestion.   Respiratory: Negative for cough and shortness of breath.   Cardiovascular: Negative for chest pain.  Gastrointestinal: Negative for abdominal pain, heartburn, nausea and vomiting.  Psychiatric/Behavioral: Negative for depression, hallucinations and suicidal ideas. The patient is not nervous/anxious and does not have insomnia.     Blood pressure (!) 131/98, pulse (!) 110, temperature 97.6 F (36.4 C), temperature source Oral, resp. rate 18, height 5\' 7"  (1.702 m), weight 91.6 kg, last menstrual period 03/09/2018, SpO2 100 %.Body mass index is 31.64 kg/m.  General Appearance: Casual and Fairly Groomed  Eye Contact:  Good  Speech:  Clear and Coherent and Normal Rate  Volume:  Normal  Mood:  Euthymic  Affect:  Appropriate and Congruent  Thought Process:  Coherent and Goal Directed  Orientation:  Full (Time, Place, and Person)  Thought Content:  Logical  Suicidal Thoughts:  No  Homicidal Thoughts:  No  Memory:  Immediate;   Fair Recent;   Fair Remote;   Fair  Judgement:  Fair  Insight:  Fair  Psychomotor Activity:  Normal  Concentration:  Concentration: Fair  Recall:  [employer] of Knowledge:  Fair  Language:  Fair  Akathisia:  No  Handed:    AIMS (if indicated):     Assets:  Resilience Social Support  ADL's:  Intact  Cognition:  WNL  Sleep:  Number of Hours: 6.75   Treatment Plan Summary: Daily contact with patient to  assess and evaluate symptoms and progress in treatment and Medication management   -Continue inpatient hospitalization.  -Will continue today 04/02/2018 plan as below except where it is noted.  -Schizoaffective disorder, bipolar type    -Continue invega 6mg  po qDay   - Continue celexa 10mg  po qDay  -microcytic anemia   -Continue ferrous sulfate 325mg  po qAM with breakfast  -Nightmares associated with previous trauma   -Continue prazosin 2 mg po qhs.  -Seasonal allergy/rhinitis     -Continue claritin 10mg  po qDay   -Continue sudafed 120mg  po BID    -Continue afrin 0.05% 1 spay each nare BID  -insomnia   -Continue trazodone 50mg  po qhs prn insomnia  -Encourage participation in groups and therapeutic milieu  -disposition planning will be ongoing  Lindell Spar, NP, PMHNP, FNP-BC. 04/02/2018, 3:14 PMPatient ID: Shary Decamp, female   DOB: [birth year], 47 y.o.   MRN: [id number]

## 2018-04-02 NOTE — BHH Group Notes (Signed)
LCSW Group Therapy Note   04/02/2018 1:15pm   Type of Therapy and Topic:  Group Therapy:  Overcoming Obstacles   Participation Level:  Active   Description of Group:    In this group patients will be encouraged to explore what they see as obstacles to their own wellness and recovery. They will be guided to discuss their thoughts, feelings, and behaviors related to these obstacles. The group will process together ways to cope with barriers, with attention given to specific choices patients can make. Each patient will be challenged to identify changes they are motivated to make in order to overcome their obstacles. This group will be process-oriented, with patients participating in exploration of their own experiences as well as giving and receiving support and challenge from other group members.   Therapeutic Goals: 1. Patient will identify personal and current obstacles as they relate to admission. 2. Patient will identify barriers that currently interfere with their wellness or overcoming obstacles.  3. Patient will identify feelings, thought process and behaviors related to these barriers. 4. Patient will identify two changes they are willing to make to overcome these obstacles:      Summary of Patient Progress  Stayed the entire time, engaged throughout.  "I need ajob so I can pay my bills.  And I want to get my drivers license back and I have to pay a fine.  I hope I still have a job waiting for me at [employer].  Can you send me with a letter saying I was in the hospital?"   Therapeutic Modalities:   Cognitive Behavioral Therapy Solution Focused Therapy Motivational Interviewing Relapse Prevention [place], LCSW 04/02/2018 4:19 PM

## 2018-04-02 NOTE — Progress Notes (Signed)
D:  Angela Conner has been up and visible on the unit.  She interacts well with staff and peers.  She denied SI/HI or A/V hallucinations.  Her main complaint today was nasal congestion.  She stated that she had been using Afrin for the past month but her congestion has gotten worse.  Explained that Afrin is good for very short term but can increase congestion with long term use.  Staffed with Angela Clan PA, new orders for Flonase and Saline nasal spray.  She took her HS medication without difficulty.  She is currently resting with her eyes closed and appears to be asleep.   A:  1:1 with RN for support and encouragement.  Medication as ordered and prn.  Q 15 minute checks maintained for safety.  Encouraged participation in group and unit activities. R:  Angela Conner remains safe on the unit.  We will continue to monitor the progress towards her goals.

## 2018-04-02 NOTE — Progress Notes (Signed)
D:  Angela Conner has been in her room much of the evening.  She continues to have difficulty with congestion.  She questioned if she was taking her blood pressure medication because her blood pressure has been elevated.  Chart reviewed and no BP medications were ordered.  Staffed with [place] PA.  New order noted and given.  She denied any pain or discomfort and appeared to be in no physical distress.  She denied SI/HI or A/V hallucinations.  She reported that she is going to be leaving tomorrow and is looking forward to going home. A:  1:1 with RN for support and encouragement.  Medications as ordered.  Q 15 minute checks maintained for safety.  Encouraged participation in group and unit activities.   R:  Angela Conner remains safe on the unit.  We will continue to monitor the progress towards her goals.

## 2018-04-02 NOTE — Plan of Care (Signed)
  Problem: Activity: Goal: Interest or engagement in activities will improve Outcome: Progressing   Problem: Activity: Goal: Sleeping patterns will improve Outcome: Progressing Note:  Pt has been sleeping well and attending groups with good participation.

## 2018-04-03 MED ORDER — FERROUS SULFATE 325 (65 FE) MG PO TABS: 325.0000 mg | ORAL_TABLET | Freq: Every day | ORAL | 0 refills | Status: DC

## 2018-04-03 MED ORDER — PALIPERIDONE ER 6 MG PO TB24: 6.0000 mg | ORAL_TABLET | Freq: Every day | ORAL | 0 refills | Status: DC

## 2018-04-03 MED ORDER — HYDROXYZINE HCL 25 MG PO TABS: 25.0000 mg | ORAL_TABLET | Freq: Four times a day (QID) | ORAL | 0 refills | Status: DC | PRN

## 2018-04-03 MED ORDER — OXYMETAZOLINE HCL 0.05 % NA SOLN: 1.0000 | Freq: Two times a day (BID) | NASAL | 0 refills | Status: DC

## 2018-04-03 MED ORDER — PRAZOSIN HCL 2 MG PO CAPS: 2.0000 mg | ORAL_CAPSULE | Freq: Every day | ORAL | 0 refills | Status: DC

## 2018-04-03 MED ORDER — LORATADINE 10 MG PO TABS: 10.0000 mg | ORAL_TABLET | Freq: Every day | ORAL | Status: DC

## 2018-04-03 MED ORDER — FLUTICASONE PROPIONATE 50 MCG/ACT NA SUSP: 2.0000 | Freq: Every day | NASAL | 2 refills | Status: DC

## 2018-04-03 MED ORDER — CITALOPRAM HYDROBROMIDE 10 MG PO TABS: 10.0000 mg | ORAL_TABLET | Freq: Every day | ORAL | 0 refills | Status: DC

## 2018-04-03 MED ORDER — LOSARTAN POTASSIUM-HCTZ 100-25 MG PO TABS: 1.0000 | ORAL_TABLET | Freq: Every day | ORAL | 0 refills | Status: DC

## 2018-04-03 MED ORDER — TRAZODONE HCL 50 MG PO TABS: 50.0000 mg | ORAL_TABLET | Freq: Every evening | ORAL | 0 refills | Status: DC | PRN

## 2018-04-03 NOTE — BHH Suicide Risk Assessment (Signed)
[facility] Discharge Suicide Risk Assessment   Principal Problem: Schizoaffective disorder, bipolar type [facility]) Discharge Diagnoses:  Patient Active Problem List   Diagnosis Date Noted  . Schizoaffective disorder, bipolar type ([place]) [F25.0] 03/30/2018    Total Time spent with patient: 30 minutes  Musculoskeletal: Strength & Muscle Tone: within normal limits Gait & Station: normal Patient leans: N/A  Psychiatric Specialty Exam: Review of Systems  Constitutional: Negative for chills and fever.  HENT: Positive for congestion.   Respiratory: Negative for cough and shortness of breath.   Cardiovascular: Negative for chest pain.  Gastrointestinal: Negative for abdominal pain, heartburn, nausea and vomiting.  Psychiatric/Behavioral: Negative for depression, hallucinations and suicidal ideas. The patient is not nervous/anxious and does not have insomnia.     Blood pressure (!) 138/96, pulse (!) 110, temperature 98.2 F (36.8 C), temperature source Oral, resp. rate 20, height 5\' 7"  (1.702 m), weight 91.6 kg, last menstrual period 03/09/2018, SpO2 100 %.Body mass index is 31.64 kg/m.  General Appearance: Casual and Fairly Groomed  Engineer, water::  Good  Speech:  Clear and Coherent and Normal Rate  Volume:  Normal  Mood:  Euthymic  Affect:  Appropriate and Congruent  Thought Process:  Coherent and Goal Directed  Orientation:  Full (Time, Place, and Person)  Thought Content:  Logical  Suicidal Thoughts:  No  Homicidal Thoughts:  No  Memory:  Immediate;   Fair Recent;   Fair Remote;   Fair  Judgement:  Fair  Insight:  Fair  Psychomotor Activity:  Normal  Concentration:  Fair  Recall:  [employer] of Knowledge:Fair  Language: Fair  Akathisia:  No  Handed:    AIMS (if indicated):     Assets:  Resilience  Sleep:  Number of Hours: 6.75  Cognition: WNL  ADL's:  Intact   Mental Status Per Nursing Assessment::   On Admission:  NA  Demographic Factors:  Low socioeconomic status  Loss  Factors: Financial problems/change in socioeconomic status  Historical Factors: Impulsivity  Risk Reduction Factors:   Sense of responsibility to family, Positive social support, Positive therapeutic relationship and Positive coping skills or problem solving skills  Continued Clinical Symptoms:  Severe Anxiety and/or Agitation Bipolar Disorder:   Depressive phase Schizophrenia:   Paranoid or undifferentiated type  Cognitive Features That Contribute To Risk:  None    Suicide Risk:  Minimal: No identifiable suicidal ideation.  Patients presenting with no risk factors but with morbid ruminations; may be classified as minimal risk based on the severity of the depressive symptoms  Follow-up Information    [facility] Follow up on 04/09/2018.   Why:  Thursday at 10:25 with your Dr., and then 11:00 with your therapist. Contact information: [place] [address] [phone]         Subjective Data:  Angela Conner is a 47 y/o F with history of schizophrenia, Bipolar I, and PTSD who [facility] where she presented with worsening sinus congestion, CAH to kill herself, and worsening depression. Pt was medically cleared and then transferred to [facility] for additional treatment and stabilization. Pt was started on trial of prazosin for nightmares and invega for AH. She reported gradual improvement of her presenting symptoms.  Today upon evaluation, pt shares, "I'm good - just still a little congested." Pt reports she is sleeping well. Her appetite is good. She denies physical complaints aside from sinus congestion which she reports is improving. She denies SI/HI/AH/VH. She is tolerating her medications well, and  she is in agreement to continue her current regimen without changes. She will continue to consider option of LAI of invega and discuss with her outpatient provider. She was able to engage in safety planning including plan to return to [facility] or contact  emergency services if she feels unable to maintain her own safety or the safety of others. Pt had no further questions, comments, or concerns.   Plan Of Care/Follow-up recommendations:   -Discharge to outpatient level of care  -Schizoaffective disorder, bipolar type             -Continue invega 6mg  po qDay             - Continue celexa 10mg  po qDay  -microcytic anemia             -Continue ferrous sulfate 325mg  po qAM with breakfast  -Nightmares associated with previous trauma              -Continue prazosin 2 mg po qhs.  -insomnia              -Continue trazodone 50mg  po qhs prn insomnia   Activity:  as tolerated Diet:  normal Tests:  NA Other:  see above for [place], MD 04/03/2018, 10:46 AM

## 2018-04-03 NOTE — Progress Notes (Signed)
Pt denied SI, HI, and AVH this a.m. She had no complaints and said she was looking forward to discharge. Later, patient was discharged per order. AVS, SRA, medications, scripts and transition summary were all reviewed with patient. Pt was given an opportunity to ask questions and verbalized understanding of all discharge paperwork. Belongings were returned, and patient signed for receipt. Patient verbalized readiness for discharge and appeared in no acute distress when escorted to lobby with bus pass.

## 2018-04-03 NOTE — Progress Notes (Signed)
Recreation Therapy Notes  Date: 8.9.19 Time: 1000 Location: 500 Hall Dayroom  Group Topic: Self-Esteem  Goal Area(s) Addresses:  Patient will successfully identify positive attributes about themselves.  Patient will successfully identify benefit of improved self-esteem.   Intervention: Worksheet  Activity: Education officer, environmental.  Patients were to identify at least three things they were good at, what they like about their appearance, how they have helped others, what they value most, compliments they have received, challenges they have overcome, things that make them unique and how they have made others happy.  Education:  Self-Esteem, Dentist.   Education Outcome: Acknowledges education/In group clarification offered/Needs additional education  Clinical Observations/Feedback: Pt did not attend group.     Victorino Sparrow, LRT/CTRS    Victorino Sparrow A 04/03/2018 11:41 AM

## 2018-04-03 NOTE — Plan of Care (Signed)
  Problem: Physical Regulation: Goal: Ability to maintain clinical measurements within normal limits will improve Outcome: Progressing-Angela Conner's blood pressure has been elevated.  She was worried because she wasn't on her blood pressure medication.  Notified PA and new orders were added.  We will continue to monitor her blood pressure.   Problem: Health Behavior/Discharge Planning: Goal: Compliance with treatment plan for underlying cause of condition will improve Outcome: Completed/Met-Angela Conner takes her medications, interacts well with staff and peers and attends groups.

## 2018-04-03 NOTE — Progress Notes (Signed)
  [facility] Adult Case Management Discharge Plan :  Will you be returning to the same living situation after discharge:  Yes,  home At discharge, do you have transportation home?: Yes,  bus pass Do you have the ability to pay for your medications: Yes,  MCD  Release of information consent forms completed and in the chart;  Patient's signature needed at discharge.  Patient to Follow up at: Follow-up Information    [facility] Follow up on 04/09/2018.   Why:  Thursday at 10:25 with your Dr., and then 11:00 with your therapist. Contact information: [address] [place] [address] [phone]           Next level of care provider has access to [place] and Suicide Prevention discussed: Yes,  yes  Have you used any form of tobacco in the last 30 days? (Cigarettes, Smokeless Tobacco, Cigars, and/or Pipes): No  Has patient been referred to the Quitline?: N/A patient is not a smoker  Patient has been referred for addiction treatment: [place], LCSW 04/03/2018, 9:02 AM

## 2018-04-03 NOTE — Progress Notes (Signed)
Recreation Therapy Notes  INPATIENT RECREATION TR PLAN  Patient Details Name: Angela Conner MRN: [id number] DOB: [birth year] Today's Date: 04/03/2018  Rec Therapy Plan Is patient appropriate for Therapeutic Recreation?: Yes Treatment times per week: about 3 days Estimated Length of Stay: 5-7 days TR Treatment/Interventions: Community reintegration  Discharge Criteria Pt will be discharged from therapy if:: Discharged Treatment plan/goals/alternatives discussed and agreed upon by:: Patient/family  Discharge Summary Short term goals set: See patient care plan. Short term goals met: Complete Progress toward goals comments: Groups attended Which groups?: Coping skills, Other (Comment)(Team building) Reason goals not met: None Therapeutic equipment acquired: N/A Reason patient discharged from therapy: Discharge from hospital Pt/family agrees with progress & goals achieved: Yes Date patient discharged from therapy: 04/03/18   Victorino Sparrow, LRT/CTRS  Ria Comment, Nevin Kozuch A 04/03/2018, 10:51 AM

## 2018-04-03 NOTE — Discharge Summary (Addendum)
Physician Discharge Summary Note  Patient:  Angela Conner is an 47 y.o., female  MRN:  [id number]  DOB:  [birth year]  Patient phone:  [phone] (home)   Patient address:   [place] [address],   Total Time spent with patient: Greater than 30 minutes  Date of Admission:  03/30/2018  Date of Discharge: 04-03-18  Reason for Admission: Worsening, depression, sinus congestion & command auditory hallucination to kill herself.   Principal Problem: Schizoaffective disorder, bipolar type [facility])  Discharge Diagnoses: Patient Active Problem List   Diagnosis Date Noted  . Schizoaffective disorder, bipolar type ([place]) [F25.0] 03/30/2018   Past Psychiatric History: Schizoaffective disorder.  Past Medical History:  Past Medical History:  Diagnosis Date  . Bipolar 1 disorder ([place])   . Hypercholesteremia   . Hypertension   . Schizophrenia ([place])    History reviewed. No pertinent surgical history.  Family History: History reviewed. No pertinent family history.  Family Psychiatric  History: See H&P  Social History:  Social History   Substance and Sexual Activity  Alcohol Use No     Social History   Substance and Sexual Activity  Drug Use Yes  . Types: Marijuana    Social History   Socioeconomic History  . Marital status: Single    Spouse name: Not on file  . Number of children: Not on file  . Years of education: Not on file  . Highest education level: Not on file  Occupational History  . Not on file  Social Needs  . Financial resource strain: Not on file  . Food insecurity:    Worry: Not on file    Inability: Not on file  . Transportation needs:    Medical: Not on file    Non-medical: Not on file  Tobacco Use  . Smoking status: Former Research scientist (life sciences)  . Smokeless tobacco: Never Used  Substance and Sexual Activity  . Alcohol use: No  . Drug use: Yes    Types: Marijuana  . Sexual activity: Not on file  Lifestyle  . Physical activity:    Days  per week: Not on file    Minutes per session: Not on file  . Stress: Not on file  Relationships  . Social connections:    Talks on phone: Not on file    Gets together: Not on file    Attends religious service: Not on file    Active member of club or organization: Not on file    Attends meetings of clubs or organizations: Not on file    Relationship status: Not on file  Other Topics Concern  . Not on file  Social History Narrative  . Not on file   Hospital Course:  (Per Md's discharge SRA): Angela Conner is a 47 y/o F with history of schizophrenia, Bipolar I, and PTSD who was [facility] from [facility] where she presented with worsening sinus congestion, CAH to kill herself, and worsening depression. Pt was medically cleared and then transferred to [facility] for additional treatment and stabilization.Pt was started on trial of prazosin for nightmares and invega for AH. She reported gradual improvement of her presenting symptoms.  Besides the use of Invega for mood control & Minipress for PTSD related nightmares, Conner was also medicated & discharged on; Citalopram 10 mg for depression, Vistaril 25 mg prn for anxiety & Trazodone 50 mg prn for anxiety. She presented other pre-existing medical issues that required treatment & or monitoring. She was resumed & discharged on those pertinent  home medications for those health issues. She tolerated her treatment regimen without any adverse effects or reactions reported. Angela was also enrolled & participated in the group counseling sessions being offered & held on this unit.  Today upon her discharge evaluation, pt shares, "I'm good - just still a little congested." Pt reports she is sleeping well. Her appetite is good. She denies physical complaints aside from sinus congestion which she reports is improving. She denies SI/HI/AH/VH. She is tolerating her medications well, and she is in agreement to continue her current regimen without changes. She will  continue to consider option of LAI of invega and discuss with her outpatient provider. She was able to engage in safety planning including plan to return to [facility] or contact emergency services if she feels unable to maintain her own safety or the safety of others. Pt had no further questions, comments, or concerns.  Physical Findings: AIMS:  , ,  ,  ,    CIWA:    COWS:     Musculoskeletal: Strength & Muscle Tone: within normal limits Gait & Station: normal Patient leans: N/A  Psychiatric Specialty Exam: Physical Exam  Constitutional: She appears well-developed.  HENT:  Head: Normocephalic.  Eyes: Pupils are equal, round, and reactive to light.  Neck: Normal range of motion.  Cardiovascular: Normal rate.  Respiratory: Effort normal.  GI: Soft.  Genitourinary:  Genitourinary Comments: Deferred  Musculoskeletal: Normal range of motion.  Neurological: She is alert.  Skin: Skin is warm.    Review of Systems  Constitutional: Negative.   HENT: Negative.   Eyes: Negative.   Respiratory: Negative.   Cardiovascular: Negative.   Gastrointestinal: Negative.   Genitourinary: Negative.   Musculoskeletal: Negative.   Skin: Negative.   Neurological: Negative.   Endo/Heme/Allergies: Negative.   Psychiatric/Behavioral: Positive for depression (Stable), hallucinations (Hx. Psychosis) and substance abuse (Hx. THC use). Negative for memory loss and suicidal ideas. The patient has insomnia (Stable). The patient is not nervous/anxious.     Blood pressure (!) 138/96, pulse (!) 110, temperature 98.2 F (36.8 C), temperature source Oral, resp. rate 20, height 5\' 7"  (1.702 m), weight 91.6 kg, last menstrual period 03/09/2018, SpO2 100 %.Body mass index is 31.64 kg/m.  See H&P   Have you used any form of tobacco in the last 30 days? (Cigarettes, Smokeless Tobacco, Cigars, and/or Pipes): No  Has this patient used any form of tobacco in the last 30 days? (Cigarettes, Smokeless Tobacco, Cigars,  and/or Pipes): N/A  Blood Alcohol level:  Lab Results  Component Value Date   ETH <10 03/30/2018   ETH <11 41/93/7902   Metabolic Disorder Labs:  No results found for: HGBA1C, MPG No results found for: PROLACTIN Lab Results  Component Value Date   CHOL 200 12/26/2009   TRIG 147 12/26/2009   HDL 34 (L) 12/26/2009   CHOLHDL 5.9 Ratio 12/26/2009   VLDL 29 12/26/2009   LDLCALC 137 (H) 12/26/2009   LDLCALC 110 (H) 11/10/2007   See Psychiatric Specialty Exam and Suicide Risk Assessment completed by Attending Physician prior to discharge.  Discharge destination:  Home  Is patient on multiple antipsychotic therapies at discharge:  No   Has Patient had three or more failed trials of antipsychotic monotherapy by history:  No  Recommended Plan for Multiple Antipsychotic Therapies: NA  Allergies as of 04/03/2018      Reactions   Tylenol [acetaminophen] Other (See Comments)   Stomach problems due to suicidal attempt       Medication  List    STOP taking these medications   ARIPiprazole 15 MG tablet Commonly known as:  ABILIFY   naproxen sodium 220 MG tablet Commonly known as:  ALEVE   sodium chloride 0.65 % Soln nasal spray Commonly known as:  OCEAN   triamcinolone cream 0.1 % Commonly known as:  KENALOG     TAKE these medications     Indication  citalopram 10 MG tablet Commonly known as:  CELEXA Take 1 tablet (10 mg total) by mouth daily. For deptression Start taking on:  04/04/2018  Indication:  Depression   ferrous sulfate 325 (65 FE) MG tablet Take 1 tablet (325 mg total) by mouth daily with breakfast. (Please buy from over the counter): For anemia Start taking on:  04/04/2018  Indication:  Anemia From Inadequate Iron in the Body   fluticasone 50 MCG/ACT nasal spray Commonly known as:  FLONASE Place 2 sprays into both nostrils daily. For allergies Start taking on:  04/04/2018  Indication:  Allergic Rhinitis, Signs and Symptoms of Nose Diseases   hydrOXYzine 25  MG tablet Commonly known as:  ATARAX/VISTARIL Take 1 tablet (25 mg total) by mouth every 6 (six) hours as needed. For anxiety What changed:    when to take this  reasons to take this  additional instructions  Indication:  Feeling Anxious   loratadine 10 MG tablet Commonly known as:  CLARITIN Take 1 tablet (10 mg total) by mouth daily. (May buy from over the counter): For allergies Start taking on:  04/04/2018  Indication:  Perennial Allergic Rhinitis, Hayfever   losartan-hydrochlorothiazide 100-25 MG tablet Commonly known as:  HYZAAR Take 1 tablet by mouth daily. For high blood pressure What changed:  additional instructions  Indication:  High Blood Pressure Disorder   oxymetazoline 0.05 % nasal spray Commonly known as:  AFRIN Place 1 spray into both nostrils 2 (two) times daily. For allergies What changed:    when to take this  reasons to take this  additional instructions  Indication:  Stuffy Nose   paliperidone 6 MG 24 hr tablet Commonly known as:  INVEGA Take 1 tablet (6 mg total) by mouth daily. For mood control Start taking on:  04/04/2018  Indication:  Mood control   prazosin 2 MG capsule Commonly known as:  MINIPRESS Take 1 capsule (2 mg total) by mouth at bedtime. For nightmare  Indication:  PTSD related nightmares   traZODone 50 MG tablet Commonly known as:  DESYREL Take 1 tablet (50 mg total) by mouth at bedtime as needed for sleep.  Indication:  Trouble Sleeping      Follow-up Information    [facility] Follow up on 04/09/2018.   Why:  Thursday at 10:25 with your Dr., and then 11:00 with your therapist. Contact information: [address] [place] [address] [phone]          Follow-up recommendations: Activity:  As tolerated Diet: As recommended by your primary care doctor. Keep all scheduled follow-up appointments as recommended.  Comments: Patient is instructed prior to discharge to: Take all medications as prescribed by his/her  mental healthcare provider. Report any adverse effects and or reactions from the medicines to his/her outpatient provider promptly. Patient has been instructed & cautioned: To not engage in alcohol and or illegal drug use while on prescription medicines. In the event of worsening symptoms, patient is instructed to call the crisis hotline, 911 and or go to the nearest ED for appropriate evaluation and treatment of symptoms. To follow-up with his/her  primary care provider for your other medical issues, concerns and or health care needs.   Signed: Lindell Spar, NP, PMHNP, FNP-BC 04/03/2018, 10:11 AM   Patient seen, Suicide Assessment Completed.  Disposition Plan Reviewed

## 2018-04-03 NOTE — Plan of Care (Signed)
Pt was able to identify coping skills at completion of recreation therapy group sessions.   Angela Conner, LRT/CTRS 

## 2018-04-09 IMAGING — CR DG CHEST 2V
2 series · 2 of 2 positions shown · non-contrast
Comparison: 03/09/2012

CLINICAL DATA: Ongoing dizziness, started blood pressure medication
1 week ago, chest pain/pressure, history hypertension,
hypercholesterolemia, former smoker

EXAM:
CHEST  2 VIEW

[w chest pa]
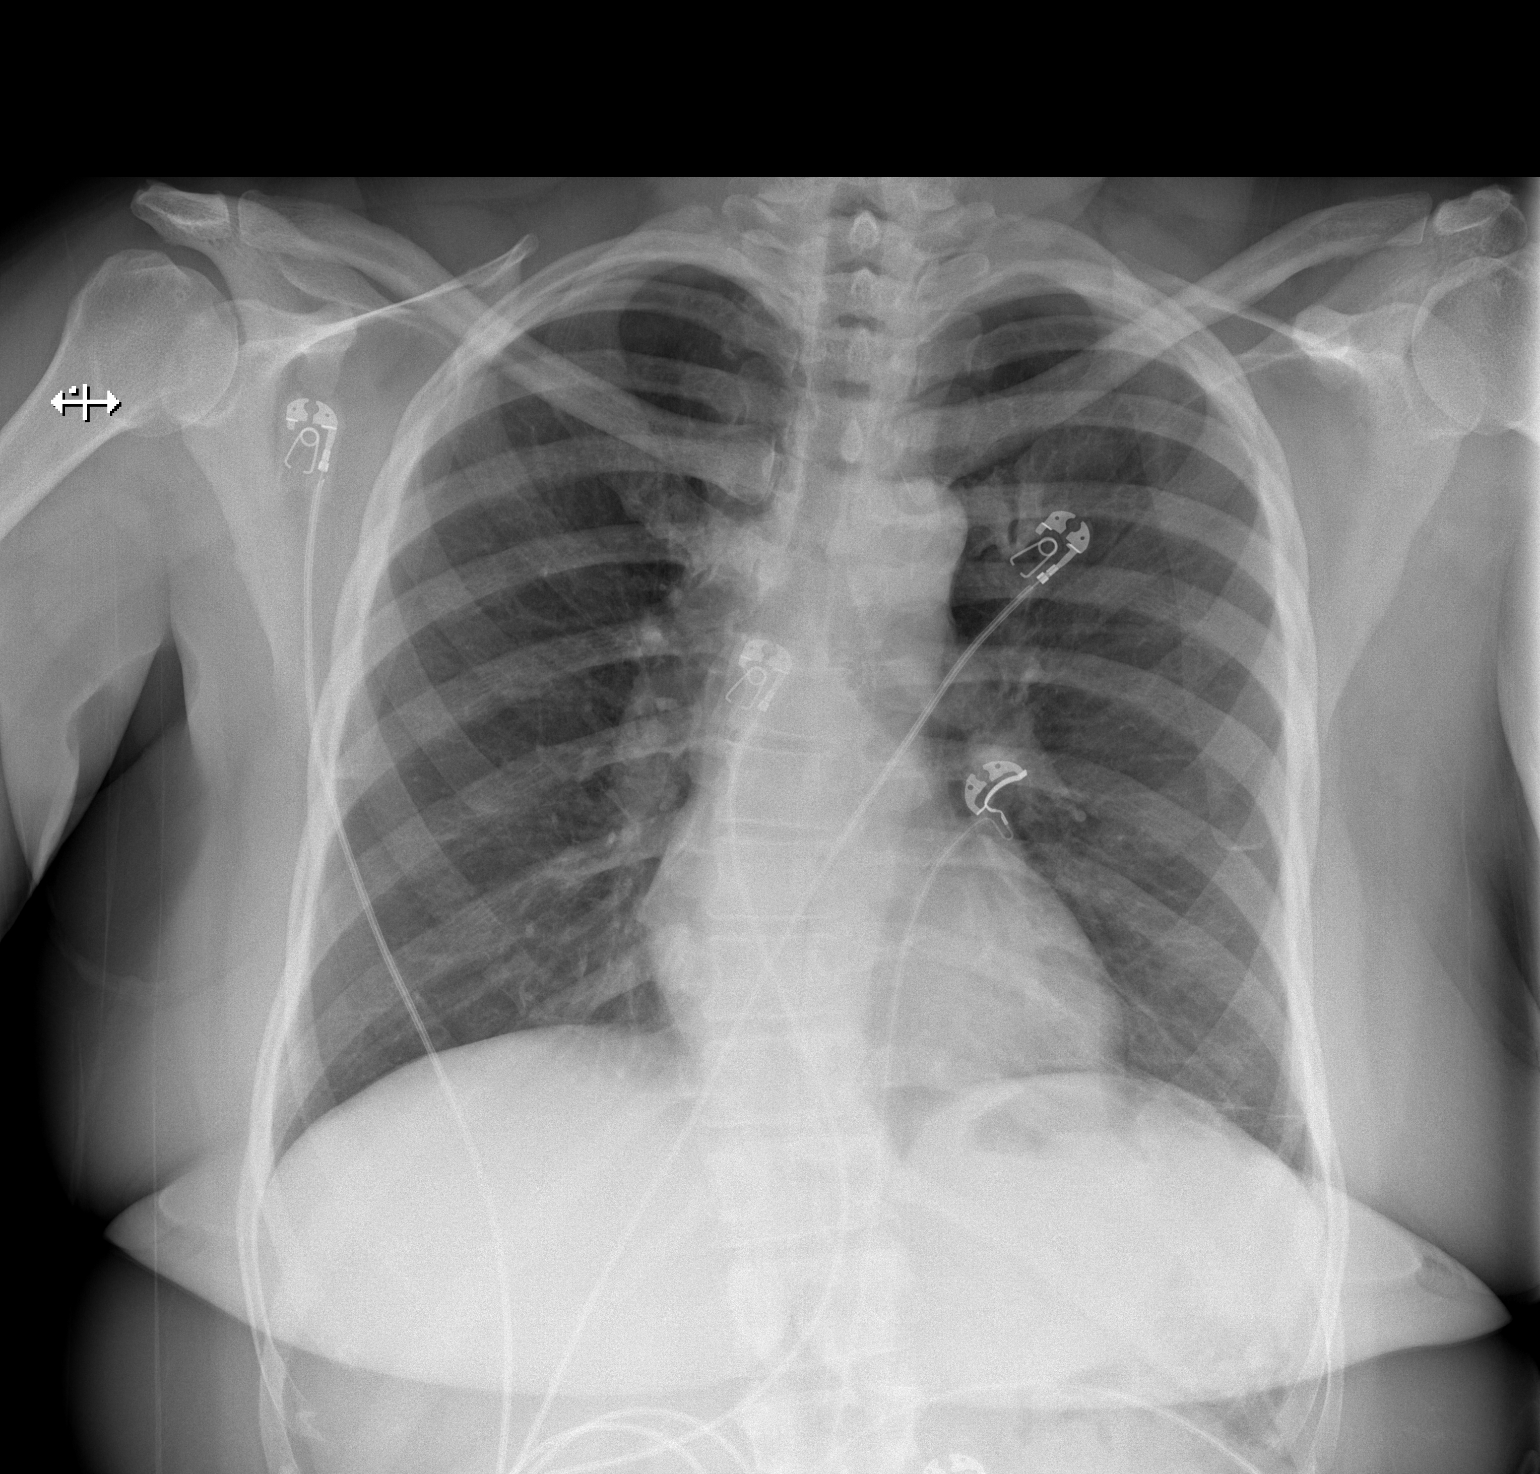

[w chest lat]
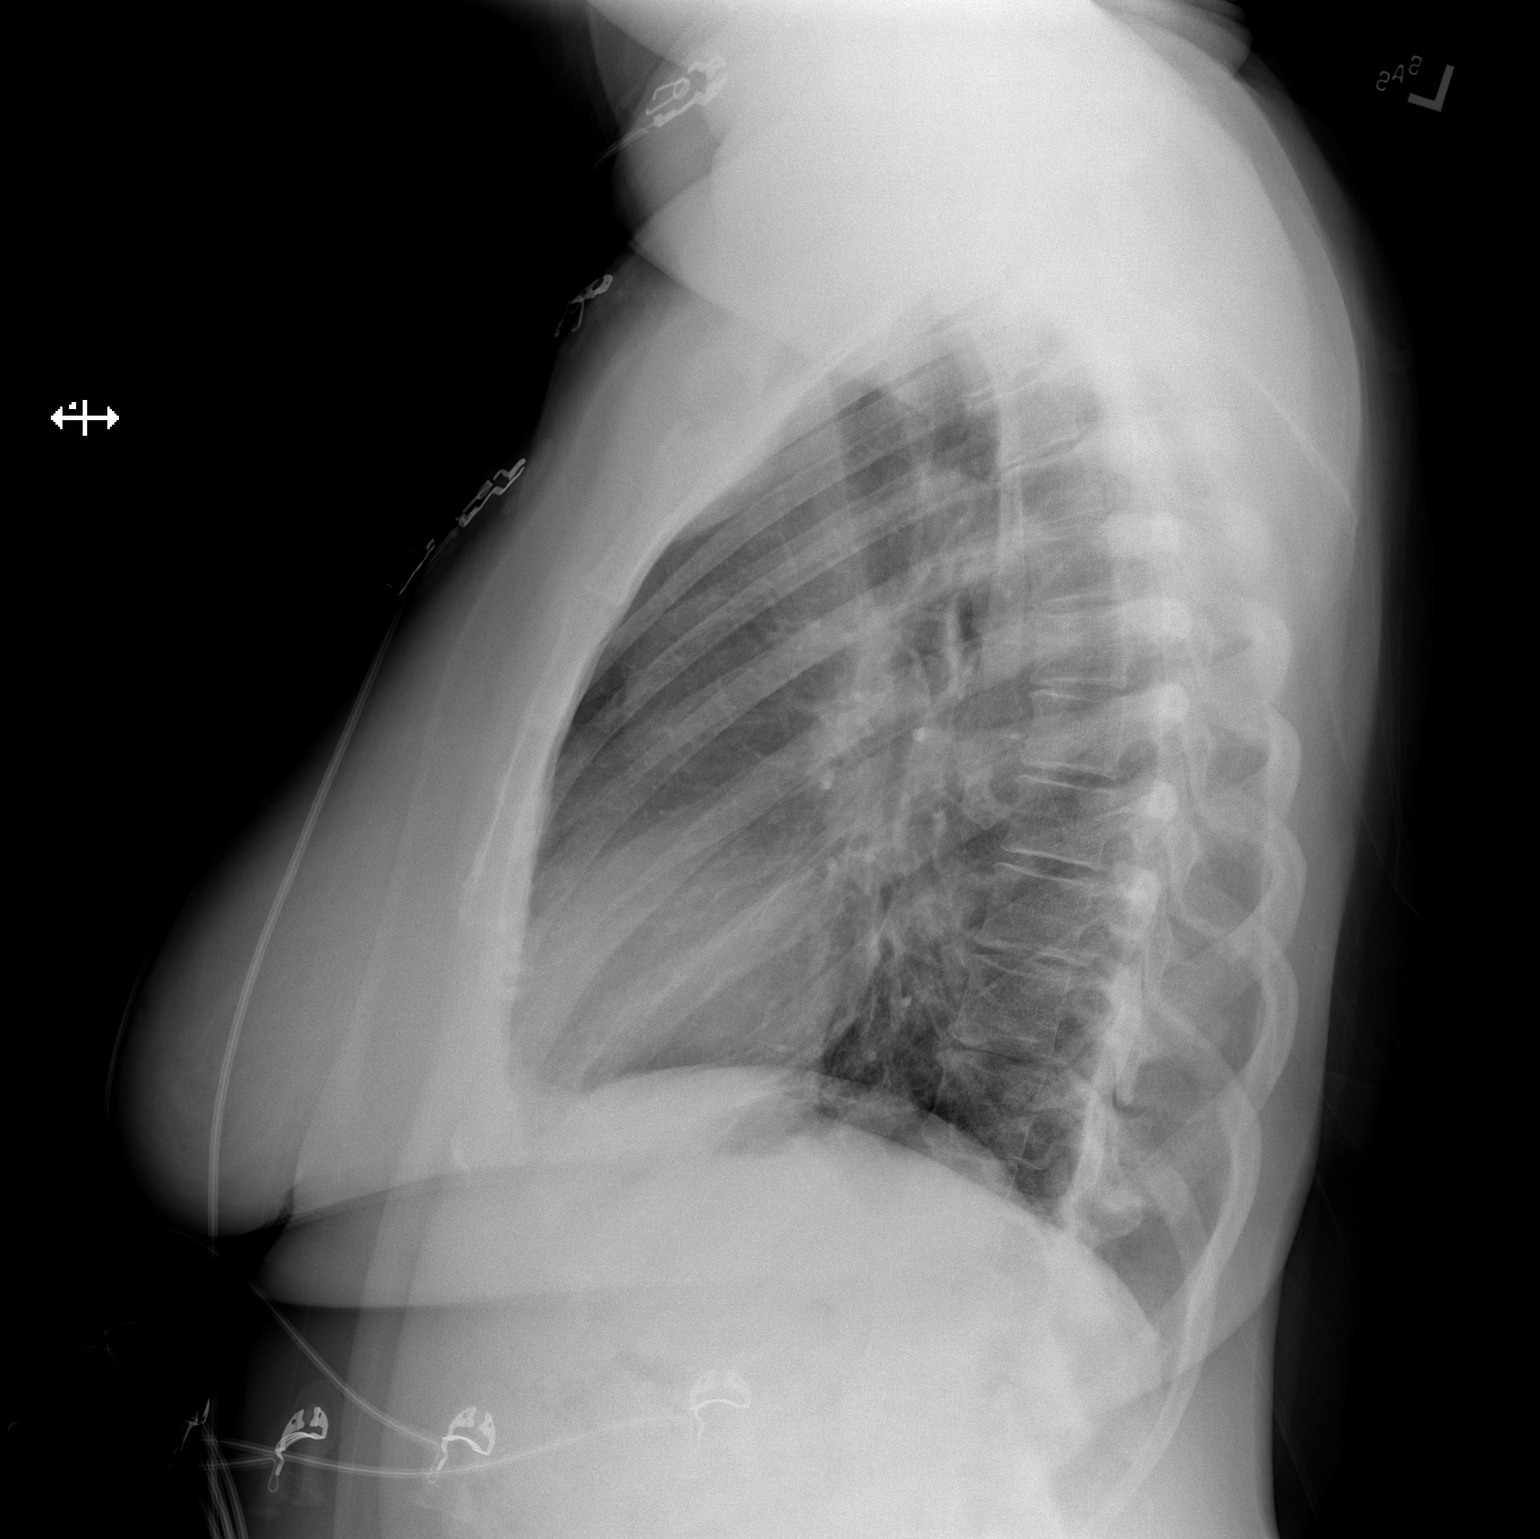

[2 of 2 positions shown; findings below may reference images not displayed]

FINDINGS: Normal heart size, mediastinal contours, and pulmonary vascularity.

Minimal subsegmental atelectasis at LEFT costophrenic angle.

Lungs otherwise clear.

No pulmonary infiltrate, pleural effusion or pneumothorax.

Bones unremarkable.
IMPRESSION: Minimal subsegmental atelectasis at LEFT base.

## 2018-05-06 DIAGNOSIS — F4310 Post-traumatic stress disorder, unspecified: Secondary | ICD-10-CM | POA: Diagnosis not present

## 2018-05-06 DIAGNOSIS — F251 Schizoaffective disorder, depressive type: Secondary | ICD-10-CM | POA: Diagnosis not present

## 2018-08-11 DIAGNOSIS — J343 Hypertrophy of nasal turbinates: Secondary | ICD-10-CM | POA: Diagnosis not present

## 2018-08-11 DIAGNOSIS — L711 Rhinophyma: Secondary | ICD-10-CM | POA: Diagnosis not present

## 2018-08-13 ENCOUNTER — Encounter (HOSPITAL_COMMUNITY): Payer: Self-pay

## 2018-08-13 ENCOUNTER — Emergency Department (HOSPITAL_COMMUNITY)
Admission: EM | Admit: 2018-08-13 | Discharge: 2018-08-13 | Disposition: A | Discharge status: Home / Self Care | Payer: Medicare Other | Attending: Emergency Medicine | Admitting: Emergency Medicine

## 2018-08-13 ENCOUNTER — Other Ambulatory Visit: Payer: Self-pay

## 2018-08-13 DIAGNOSIS — Z7982 Long term (current) use of aspirin: Secondary | ICD-10-CM | POA: Diagnosis not present

## 2018-08-13 DIAGNOSIS — Z87891 Personal history of nicotine dependence: Secondary | ICD-10-CM | POA: Insufficient documentation

## 2018-08-13 DIAGNOSIS — G4489 Other headache syndrome: Secondary | ICD-10-CM

## 2018-08-13 DIAGNOSIS — I10 Essential (primary) hypertension: Secondary | ICD-10-CM

## 2018-08-13 DIAGNOSIS — R51 Headache: Secondary | ICD-10-CM | POA: Diagnosis not present

## 2018-08-13 MED ORDER — LOSARTAN POTASSIUM-HCTZ 100-25 MG PO TABS: 1.0000 | ORAL_TABLET | Freq: Every day | ORAL | 1 refills | Status: DC

## 2018-08-13 MED ORDER — IBUPROFEN 800 MG PO TABS
800.0000 mg | ORAL_TABLET | Freq: Once | ORAL | Status: AC
  Administered 2018-08-13: 800 mg via ORAL
  Filled 2018-08-13: qty 1

## 2018-08-13 MED ORDER — ONDANSETRON 4 MG PO TBDP
4.0000 mg | ORAL_TABLET | Freq: Once | ORAL | Status: AC
  Administered 2018-08-13: 4 mg via ORAL
  Filled 2018-08-13: qty 1

## 2018-08-13 MED ORDER — HYDROCHLOROTHIAZIDE 12.5 MG PO CAPS
25.0000 mg | ORAL_CAPSULE | Freq: Once | ORAL | Status: AC
  Administered 2018-08-13: 25 mg via ORAL
  Filled 2018-08-13: qty 2

## 2018-08-13 MED ORDER — FENTANYL CITRATE (PF) 100 MCG/2ML IJ SOLN
50.0000 ug | Freq: Once | INTRAMUSCULAR | Status: AC
  Administered 2018-08-13: 50 ug via INTRAMUSCULAR
  Filled 2018-08-13: qty 2

## 2018-08-13 MED ORDER — LOSARTAN POTASSIUM 50 MG PO TABS
100.0000 mg | ORAL_TABLET | Freq: Once | ORAL | Status: AC
  Administered 2018-08-13: 100 mg via ORAL
  Filled 2018-08-13: qty 2

## 2018-08-13 NOTE — Discharge Instructions (Signed)
Take your blood pressure as directed

## 2018-08-13 NOTE — ED Notes (Signed)
Pt reports nausea. Pt states she has not ate this morning. Pt denied nausea up until this point. PA made aware. Per PA: Provide crackers and administer antiemetic.

## 2018-08-13 NOTE — ED Notes (Signed)
Bed: [facility] Expected date:  Expected time:  Means of arrival:  Comments: EMS-HTN

## 2018-08-13 NOTE — ED Notes (Signed)
ED Provider at bedside. 

## 2018-08-13 NOTE — ED Provider Notes (Signed)
[place] DEPT Provider Note   CSN: [id number] Arrival date & time: 08/13/18  0300     History   Chief Complaint Chief Complaint  Patient presents with  . Headache  . Hypertension    HPI Angela Conner is a 47 y.o. female.  The history is provided by the patient. No language interpreter was used.  Headache   This is a new problem. The current episode started 12 to 24 hours ago. The problem occurs constantly. The problem has been gradually worsening. The headache is associated with nothing. The pain is moderate. The pain does not radiate. She has tried nothing for the symptoms. The treatment provided no relief.  Hypertension  Associated symptoms include headaches.  Pt reports she has headaches when her blood pressure is elevated.  Pt is out of hyzar. No current MD   Past Medical History:  Diagnosis Date  . Bipolar 1 disorder ([place])   . Hypercholesteremia   . Hypertension   . Schizophrenia [facility])     Patient Active Problem List   Diagnosis Date Noted  . Schizoaffective disorder, bipolar type ([place]) 03/30/2018    History reviewed. No pertinent surgical history.   OB History   No obstetric history on file.      Home Medications    Prior to Admission medications   Medication Sig Start Date End Date Taking? Authorizing Provider  aspirin-acetaminophen-caffeine (EXCEDRIN MIGRAINE) [phone] MG tablet Take 2 tablets by mouth every 6 (six) hours as needed for headache.   Yes [provider]  losartan-hydrochlorothiazide (HYZAAR) 100-25 MG tablet Take 1 tablet by mouth daily. For high blood pressure 08/13/18   Fransico Meadow, PA-C    Family History History reviewed. No pertinent family history.  Social History Social History   Tobacco Use  . Smoking status: Former Research scientist (life sciences)  . Smokeless tobacco: Never Used  Substance Use Topics  . Alcohol use: No  . Drug use: Yes    Types: Marijuana     Allergies   Tylenol  [acetaminophen]   Review of Systems Review of Systems  Neurological: Positive for headaches.     Physical Exam Updated Vital Signs BP (!) 170/107 (BP Location: Right Arm)   Pulse 89   Temp 98.4 F (36.9 C) (Oral)   Resp 19   Wt 94.8 kg   SpO2 99%   BMI 32.73 kg/m   Physical Exam Vitals signs and nursing note reviewed.  Constitutional:      Appearance: She is well-developed.  HENT:     Head: Normocephalic.  Neck:     Musculoskeletal: Normal range of motion.  Cardiovascular:     Rate and Rhythm: Normal rate and regular rhythm.  Pulmonary:     Effort: Pulmonary effort is normal.  Abdominal:     General: There is no distension.  Musculoskeletal: Normal range of motion.  Skin:    General: Skin is warm.  Neurological:     Mental Status: She is alert and oriented to person, place, and time.  Psychiatric:        Mood and Affect: Mood normal.      ED Treatments / Results  Labs (all labs ordered are listed, but only abnormal results are displayed) Labs Reviewed - No data to display  EKG None  Radiology No results found.  Procedures Procedures (including critical care time)  Medications Ordered in ED Medications  fentaNYL (SUBLIMAZE) injection 50 mcg (has no administration in time range)  losartan (COZAAR) tablet 100 mg (  100 mg Oral Given 08/13/18 1050)  hydrochlorothiazide (MICROZIDE) capsule 25 mg (25 mg Oral Given 08/13/18 1025)  ibuprofen (ADVIL,MOTRIN) tablet 800 mg (800 mg Oral Given 08/13/18 1024)  ondansetron (ZOFRAN-ODT) disintegrating tablet 4 mg (4 mg Oral Given 08/13/18 1059)     Initial Impression / Assessment and Plan / ED Course  I have reviewed the triage vital signs and the nursing notes.  Pertinent labs & imaging results that were available during my care of the patient were reviewed by me and considered in my medical decision making (see chart for details).     MDM  Pt given blood pressure medications.  Bp decreased to 150/90.  Pt  still has a headache.  Pt has a normal neuro exam.  Pt given fentanyl 50 IM.  Pt given rx for blood pressure for 2 months    Final Clinical Impressions(s) / ED Diagnoses   Final diagnoses:  Hypertension, unspecified type  Other headache syndrome    ED Discharge Orders         Ordered    losartan-hydrochlorothiazide (HYZAAR) 100-25 MG tablet  Daily     08/13/18 1241        An After Visit Summary was printed and given to the patient.    Sidney Ace 08/13/18 Fill[place], Kevin, MD 08/13/18 [phone]

## 2018-08-13 NOTE — ED Notes (Signed)
Awaiting Cozaar from pharmacy

## 2018-08-13 NOTE — ED Provider Notes (Signed)
[facility] DEPT Provider Note   CSN: [id number] Arrival date & time: 08/13/18  5621     History   Chief Complaint Chief Complaint  Patient presents with  . Headache  . Hypertension    HPI Angela Conner is a 47 y.o. female.  The history is provided by the patient. No language interpreter was used.  Headache   This is a new problem. The current episode started 6 to 12 hours ago. The problem occurs constantly. The problem has been gradually worsening. The headache is associated with nothing. The pain is moderate. The pain does not radiate. Pertinent negatives include no fever and no chest pressure. She has tried nothing for the symptoms. The treatment provided no relief.  Pt reports she is out of her blood pressure medications. Pt reports she also has a heaadche.  Pt reports she gets a headache when she runs out of her blood pressure medications   Past Medical History:  Diagnosis Date  . Bipolar 1 disorder ([place])   . Hypercholesteremia   . Hypertension   . Schizophrenia [facility])     Patient Active Problem List   Diagnosis Date Noted  . Schizoaffective disorder, bipolar type ([place]) 03/30/2018    History reviewed. No pertinent surgical history.   OB History   No obstetric history on file.      Home Medications    Prior to Admission medications   Medication Sig Start Date End Date Taking? Authorizing Provider  aspirin-acetaminophen-caffeine (EXCEDRIN MIGRAINE) [phone] MG tablet Take 2 tablets by mouth every 6 (six) hours as needed for headache.   Yes [provider]  citalopram (CELEXA) 10 MG tablet Take 1 tablet (10 mg total) by mouth daily. For deptression Patient not taking: Reported on 08/13/2018 04/04/18   Lindell Spar I, NP  ferrous sulfate 325 (65 FE) MG tablet Take 1 tablet (325 mg total) by mouth daily with breakfast. (Please buy from over the counter): For anemia Patient not taking: Reported on 08/13/2018 04/04/18    Lindell Spar I, NP  fluticasone (FLONASE) 50 MCG/ACT nasal spray Place 2 sprays into both nostrils daily. For allergies Patient not taking: Reported on 08/13/2018 04/04/18   Lindell Spar I, NP  hydrOXYzine (ATARAX/VISTARIL) 25 MG tablet Take 1 tablet (25 mg total) by mouth every 6 (six) hours as needed. For anxiety Patient not taking: Reported on 08/13/2018 04/03/18   Lindell Spar I, NP  loratadine (CLARITIN) 10 MG tablet Take 1 tablet (10 mg total) by mouth daily. (May buy from over the counter): For allergies Patient not taking: Reported on 08/13/2018 04/04/18   Lindell Spar I, NP  losartan-hydrochlorothiazide (HYZAAR) 100-25 MG tablet Take 1 tablet by mouth daily. For high blood pressure Patient not taking: Reported on 08/13/2018 04/03/18   Lindell Spar I, NP  oxymetazoline (AFRIN) 0.05 % nasal spray Place 1 spray into both nostrils 2 (two) times daily. For allergies Patient not taking: Reported on 08/13/2018 04/03/18   Lindell Spar I, NP  paliperidone (INVEGA) 6 MG 24 hr tablet Take 1 tablet (6 mg total) by mouth daily. For mood control Patient not taking: Reported on 08/13/2018 04/04/18   Lindell Spar I, NP  prazosin (MINIPRESS) 2 MG capsule Take 1 capsule (2 mg total) by mouth at bedtime. For nightmare Patient not taking: Reported on 08/13/2018 04/03/18   Lindell Spar I, NP  traZODone (DESYREL) 50 MG tablet Take 1 tablet (50 mg total) by mouth at bedtime as needed for sleep. Patient not taking:  Reported on 08/13/2018 04/03/18   Encarnacion Slates, NP    Family History History reviewed. No pertinent family history.  Social History Social History   Tobacco Use  . Smoking status: Former Research scientist (life sciences)  . Smokeless tobacco: Never Used  Substance Use Topics  . Alcohol use: No  . Drug use: Yes    Types: Marijuana     Allergies   Tylenol [acetaminophen]   Review of Systems Review of Systems  Constitutional: Negative for fever.  Neurological: Positive for headaches.  All other systems reviewed and  are negative.    Physical Exam Updated Vital Signs BP (!) 179/110 (BP Location: Left Arm)   Pulse 88   Temp 98.4 F (36.9 C) (Oral)   Resp 18   Wt 94.8 kg   SpO2 100%   BMI 32.73 kg/m   Physical Exam Vitals signs and nursing note reviewed.  Constitutional:      Appearance: She is well-developed.  HENT:     Head: Normocephalic.     Mouth/Throat:     Mouth: Mucous membranes are moist.  Eyes:     Extraocular Movements: Extraocular movements intact.  Neck:     Musculoskeletal: Normal range of motion.  Cardiovascular:     Rate and Rhythm: Normal rate and regular rhythm.  Pulmonary:     Effort: Pulmonary effort is normal.  Abdominal:     General: There is no distension.  Musculoskeletal: Normal range of motion.  Skin:    General: Skin is warm.  Neurological:     Mental Status: She is alert and oriented to person, place, and time.     GCS: GCS eye subscore is 4. GCS verbal subscore is 5. GCS motor subscore is 6.     Motor: No weakness.     Coordination: Coordination normal.  Psychiatric:        Mood and Affect: Mood normal.      ED Treatments / Results  Labs (all labs ordered are listed, but only abnormal results are displayed) Labs Reviewed - No data to display  EKG None  Radiology No results found.  Procedures Procedures (including critical care time)  Medications Ordered in ED Medications - No data to display   Initial Impression / Assessment and Plan / ED Course  I have reviewed the triage vital signs and the nursing notes.  Pertinent labs & imaging results that were available during my care of the patient were reviewed by me and considered in my medical decision making (see chart for details).     MDM  Pt given blood pressure medications and ibuprofen   Final Clinical Impressions(s) / ED Diagnoses   Final diagnoses:  None    ED Discharge Orders    None       Sidney Ace 08/13/18 1356    Jola Schmidt, MD 08/13/18  1558

## 2018-08-13 NOTE — ED Triage Notes (Addendum)
Pt arrives via [facility] from work. Pt reports a headache and she "thinks her BP is elevated" Pt reports she is supposed to take BP meds but "has been out of them for months" BP with EMS 170/120. Pt states she cannot afford the medication and does not have the transportation to get her medication.

## 2018-09-21 DIAGNOSIS — F4310 Post-traumatic stress disorder, unspecified: Secondary | ICD-10-CM | POA: Diagnosis not present

## 2018-09-21 DIAGNOSIS — F251 Schizoaffective disorder, depressive type: Secondary | ICD-10-CM | POA: Diagnosis not present

## 2018-10-22 DIAGNOSIS — D485 Neoplasm of uncertain behavior of skin: Secondary | ICD-10-CM | POA: Diagnosis not present

## 2018-11-12 ENCOUNTER — Encounter (HOSPITAL_BASED_OUTPATIENT_CLINIC_OR_DEPARTMENT_OTHER): Payer: Self-pay | Admitting: *Deleted

## 2018-11-12 ENCOUNTER — Other Ambulatory Visit: Payer: Self-pay

## 2018-11-12 ENCOUNTER — Emergency Department (HOSPITAL_BASED_OUTPATIENT_CLINIC_OR_DEPARTMENT_OTHER)
Admission: EM | Admit: 2018-11-12 | Discharge: 2018-11-12 | Disposition: A | Discharge status: Home / Self Care | Payer: Medicare Other | Attending: Emergency Medicine | Admitting: Emergency Medicine

## 2018-11-12 DIAGNOSIS — I10 Essential (primary) hypertension: Secondary | ICD-10-CM | POA: Diagnosis not present

## 2018-11-12 DIAGNOSIS — Z87891 Personal history of nicotine dependence: Secondary | ICD-10-CM | POA: Insufficient documentation

## 2018-11-12 DIAGNOSIS — R0981 Nasal congestion: Secondary | ICD-10-CM

## 2018-11-12 NOTE — ED Provider Notes (Signed)
[facility]ERGENCY DEPARTMENT Provider Note   CSN: [id number] Arrival date & time: 11/12/18  1318    History   Chief Complaint Chief Complaint  Patient presents with  . Nasal Congestion    HPI Angela Conner is a 48 y.o. female presenting for evaluation of nasal congestion.  Patient states she has had having issues with nasal congestion for the past year.  She has had gradual worsening nasal congestion, but nothing is different today.  She is here because her brother came to the ER for medication refill.  Patient states she is using Afrin "all the time "and has been doing so for months.  When asked to put a number to it, patient states she is using Afrin about 4 times a day. When asked, patient states that she did see the ear nose and throat doctor in December, 3 months ago, during which they recommended turbinate surgery.  Patient states she has not been back to follow-up with them, as she is hoping for an answer other than surgery.  She denies fevers, chills, sinus pressure, ear pain, sore throat, cough, chest pain, shortness breath, nausea, vomiting, abdominal pain.  Patient states she has no other medical problems, takes no medications daily.  She has tried [facility] and over-the-counter allergy pills without improvement of symptoms.     HPI  Past Medical History:  Diagnosis Date  . Bipolar 1 disorder ([place])   . Hypercholesteremia   . Hypertension   . Schizophrenia [facility])     Patient Active Problem List   Diagnosis Date Noted  . Schizoaffective disorder, bipolar type ([place]) 03/30/2018    History reviewed. No pertinent surgical history.   OB History   No obstetric history on file.      Home Medications    Prior to Admission medications   Medication Sig Start Date End Date Taking? Authorizing Provider  losartan-hydrochlorothiazide (HYZAAR) 100-25 MG tablet Take 1 tablet by mouth daily. For high blood pressure 08/13/18  Yes Fransico Meadow, PA-C   aspirin-acetaminophen-caffeine (EXCEDRIN MIGRAINE) [phone] MG tablet Take 2 tablets by mouth every 6 (six) hours as needed for headache.    [provider]    Family History No family history on file.  Social History Social History   Tobacco Use  . Smoking status: Former Research scientist (life sciences)  . Smokeless tobacco: Never Used  Substance Use Topics  . Alcohol use: No  . Drug use: Yes    Types: Marijuana     Allergies   Tylenol [acetaminophen]   Review of Systems Review of Systems  HENT: Positive for congestion.   Respiratory: Negative for cough.      Physical Exam Updated Vital Signs BP 133/87 (BP Location: Right Arm)   Pulse 88   Temp 98.4 F (36.9 C) (Oral)   Resp 16   Ht 5\' 7"  (1.702 m)   Wt 95.5 kg   LMP 10/14/2018   SpO2 100%   BMI 32.97 kg/m   Physical Exam Vitals signs and nursing note reviewed.  Constitutional:      General: She is not in acute distress.    Appearance: She is well-developed.  HENT:     Head: Normocephalic and atraumatic.     Right Ear: Tympanic membrane, ear canal and external ear normal.     Left Ear: Tympanic membrane, ear canal and external ear normal.     Nose: Mucosal edema and congestion present.     Right Sinus: No maxillary sinus tenderness or frontal sinus  tenderness.     Left Sinus: No maxillary sinus tenderness or frontal sinus tenderness.     Comments: Significantly swollen turbinates bilaterally, almost touching.  No tenderness palpation of sinuses.    Mouth/Throat:     Mouth: Mucous membranes are moist.     Pharynx: Uvula midline. No pharyngeal swelling, oropharyngeal exudate, posterior oropharyngeal erythema or uvula swelling.     Tonsils: No tonsillar exudate or tonsillar abscesses. 0 on the right. 0 on the left.  Eyes:     Extraocular Movements: Extraocular movements intact.     Conjunctiva/sclera: Conjunctivae normal.     Pupils: Pupils are equal, round, and reactive to light.  Neck:     Musculoskeletal: Normal  range of motion.  Cardiovascular:     Rate and Rhythm: Normal rate and regular rhythm.     Pulses: Normal pulses.     Heart sounds: Normal heart sounds.  Pulmonary:     Effort: Pulmonary effort is normal.     Breath sounds: Normal breath sounds. No rhonchi or rales.  Chest:     Chest wall: No tenderness.  Abdominal:     General: There is no distension.  Musculoskeletal: Normal range of motion.  Skin:    General: Skin is warm.     Findings: No rash.  Neurological:     Mental Status: She is alert and oriented to person, place, and time.      ED Treatments / Results  Labs (all labs ordered are listed, but only abnormal results are displayed) Labs Reviewed - No data to display  EKG None  Radiology No results found.  Procedures Procedures (including critical care time)  Medications Ordered in ED Medications - No data to display   Initial Impression / Assessment and Plan / ED Course  I have reviewed the triage vital signs and the nursing notes.  Pertinent labs & imaging results that were available during my care of the patient were reviewed by me and considered in my medical decision making (see chart for details).        Patient presenting for evaluation 1 year history of nasal congestion.  This is in the setting of daily Afrin use for many months.  No new symptoms today including fever or worsening pain.  As such, low suspicion for infection.  Consider rebound congestion versus chronic congestion.  ENT note reviewed, they had recommended surgery.  Discussed with patient.  Discussed importance of reducing Afrin use gradually, and importance of follow-up with ENT for further evaluation.  At this time, patient appears safe for discharge.  Return precautions given.  Patient states she understands and agrees plan.   Final Clinical Impressions(s) / ED Diagnoses   Final diagnoses:  Chronic nasal congestion    ED Discharge Orders    None       Franchot Heidelberg,  PA-C 11/12/18 1438    Maudie Flakes, MD 11/12/18 1451

## 2018-11-12 NOTE — ED Notes (Signed)
Pt tried to [employer]

## 2018-11-12 NOTE — Discharge Instructions (Addendum)
Try to decrease the amount of Afrin you are using.  This should be done very slowly.  Start by taking 1 time that you use Afrin a day and using it only on one side of your nose.  Then switch to the other side the next day.  In the meantime, use Flonase, Neti pot and other congestion medication to try to help.  Follow up with the ear nose and throat doctor for further evaluation of your symptoms. Return to the emergency room with any new, worsening, concerning symptoms.

## 2018-11-12 NOTE — ED Triage Notes (Signed)
Stuffy nose for a year. She has been using Afrin daily for 8 months. It no longer helps.

## 2019-02-12 DIAGNOSIS — F4310 Post-traumatic stress disorder, unspecified: Secondary | ICD-10-CM | POA: Diagnosis not present

## 2019-02-12 DIAGNOSIS — F251 Schizoaffective disorder, depressive type: Secondary | ICD-10-CM | POA: Diagnosis not present

## 2019-03-12 ENCOUNTER — Encounter (HOSPITAL_COMMUNITY): Payer: Self-pay | Admitting: Emergency Medicine

## 2019-03-12 ENCOUNTER — Ambulatory Visit (HOSPITAL_COMMUNITY)
Admission: EM | Admit: 2019-03-12 | Discharge: 2019-03-12 | Disposition: A | Discharge status: Home / Self Care | Payer: Medicare Other | Attending: Urgent Care | Admitting: Urgent Care

## 2019-03-12 ENCOUNTER — Other Ambulatory Visit: Payer: Self-pay

## 2019-03-12 ENCOUNTER — Ambulatory Visit (INDEPENDENT_AMBULATORY_CARE_PROVIDER_SITE_OTHER): Payer: Medicare Other

## 2019-03-12 DIAGNOSIS — M25559 Pain in unspecified hip: Secondary | ICD-10-CM

## 2019-03-12 DIAGNOSIS — M79604 Pain in right leg: Secondary | ICD-10-CM | POA: Diagnosis not present

## 2019-03-12 DIAGNOSIS — I10 Essential (primary) hypertension: Secondary | ICD-10-CM

## 2019-03-12 DIAGNOSIS — M25551 Pain in right hip: Secondary | ICD-10-CM | POA: Diagnosis not present

## 2019-03-12 DIAGNOSIS — Z20828 Contact with and (suspected) exposure to other viral communicable diseases: Secondary | ICD-10-CM | POA: Diagnosis not present

## 2019-03-12 DIAGNOSIS — M79651 Pain in right thigh: Secondary | ICD-10-CM

## 2019-03-12 DIAGNOSIS — R030 Elevated blood-pressure reading, without diagnosis of hypertension: Secondary | ICD-10-CM

## 2019-03-12 MED ORDER — LOSARTAN POTASSIUM-HCTZ 100-25 MG PO TABS: 0.5000 | ORAL_TABLET | Freq: Every day | ORAL | 0 refills | Status: DC

## 2019-03-12 MED ORDER — CYCLOBENZAPRINE HCL 5 MG PO TABS: 5.0000 mg | ORAL_TABLET | Freq: Three times a day (TID) | ORAL | 0 refills | Status: DC | PRN

## 2019-03-12 MED ORDER — DICLOFENAC SODIUM 75 MG PO TBEC: 75.0000 mg | DELAYED_RELEASE_TABLET | Freq: Two times a day (BID) | ORAL | 1 refills | Status: DC

## 2019-03-12 NOTE — ED Provider Notes (Addendum)
MRN: [id number] DOB: 1970-10-17  Subjective:   Angela Conner is a 48 y.o. female presenting for acute onset worsening right upper thigh/hip pain a few hours ago.  Patient reports that she was going to work when symptoms started, was driving.  Pain is severe, has difficulty bending at the hip and her general movement, pain is sharp.  Denies fall, trauma, history of arthritis or gout.  However, she does state that her left great toe has been hurting for months.  She gets some help from [facility] but the pain returns.  Patient has a hx of HTN, HL. Has been out of her medication. Does not have a PCP, needs refill.    No current facility-administered medications for this encounter.   Current Outpatient Medications:  .  aspirin-acetaminophen-caffeine (EXCEDRIN MIGRAINE) 250-250-65 MG tablet, Take 2 tablets by mouth every 6 (six) hours as needed for headache., Disp: , Rfl:  .  losartan-hydrochlorothiazide (HYZAAR) 100-25 MG tablet, Take 1 tablet by mouth daily. For high blood pressure, Disp: 30 tablet, Rfl: 1    Allergies  Allergen Reactions  . Tylenol [Acetaminophen] Other (See Comments)    Stomach problems due to suicidal attempt     Past Medical History:  Diagnosis Date  . Bipolar 1 disorder ([place])   . Hypercholesteremia   . Hypertension   . Schizophrenia ([place])      History reviewed. No pertinent surgical history.   Review of Systems  Constitutional: Negative for chills, fever and malaise/fatigue.  Respiratory: Negative for cough and shortness of breath.   Cardiovascular: Negative for chest pain.  Gastrointestinal: Negative for abdominal pain, constipation, diarrhea, nausea and vomiting.  Genitourinary: Negative for dysuria, flank pain, frequency, hematuria and urgency.  Musculoskeletal: Positive for joint pain and myalgias. Negative for back pain.  Skin: Negative for rash.  Neurological: Negative for dizziness and headaches.  Psychiatric/Behavioral: Negative for substance abuse.    Objective:   Vitals: BP (!) 160/112   Pulse 89   Temp 98.9 F (37.2 C) (Oral)   Resp 16   LMP 09/10/2018   SpO2 96%   Physical Exam Constitutional:      General: She is not in acute distress.    Appearance: Normal appearance. She is well-developed. She is not ill-appearing, toxic-appearing or diaphoretic.  HENT:     Head: Normocephalic and atraumatic.     Nose: Nose normal.     Mouth/Throat:     Mouth: Mucous membranes are moist.     Pharynx: Oropharynx is clear.  Eyes:     General: No scleral icterus.    Extraocular Movements: Extraocular movements intact.     Pupils: Pupils are equal, round, and reactive to light.  Cardiovascular:     Rate and Rhythm: Normal rate and regular rhythm.     Pulses: Normal pulses.     Heart sounds: Normal heart sounds. No murmur. No friction rub. No gallop.   Pulmonary:     Effort: Pulmonary effort is normal. No respiratory distress.     Breath sounds: Normal breath sounds. No stridor. No wheezing, rhonchi or rales.  Musculoskeletal:     Right hip: She exhibits decreased range of motion, decreased strength (Secondary to pain) and tenderness (Over area outlined). She exhibits no bony tenderness, no swelling, no crepitus and no deformity.       Legs:  Skin:    General: Skin is warm and dry.     Findings: No rash.  Neurological:     General: No focal deficit present.  Mental Status: She is alert and oriented to person, place, and time.  Psychiatric:        Mood and Affect: Mood normal.        Behavior: Behavior normal.        Thought Content: Thought content normal.    Dg Hip Unilat With Pelvis 2-3 Views Right  Result Date: 03/12/2019 CLINICAL DATA:  Right hip pain, no injury. EXAM: DG HIP (WITH OR WITHOUT PELVIS) 2-3V RIGHT COMPARISON:  None. FINDINGS: There is no evidence of hip fracture or dislocation. There is no evidence of arthropathy or other focal bone abnormality. IMPRESSION: Negative. Electronically Signed   By: Lovena Le M.D.   On: 03/12/2019 17:06    Assessment and Plan :   1. Right leg pain   2. Pain of right thigh     We will manage conservatively for musculoskeletal type pain.  Counseled on use of NSAID, muscle relaxant and modification of physical activity.  Anticipatory guidance provided.  I counseled that she may also have care and that an NSAID could potentially help with this.  Counseled patient on potential for adverse effects with medications prescribed/recommended today, ER and return-to-clinic precautions discussed, patient verbalized understanding. Counseled on need to establish with a PCP for further work-up and follow-up.  I placed patient in a work queue with [facility] for PCP assistance.    Jaynee Eagles, PA-C 03/12/19 1719    Jaynee Eagles, PA-C 03/12/19 1721

## 2019-03-12 NOTE — ED Triage Notes (Signed)
PT reports sudden throbbing pain over right thigh that started today. No injury.

## 2019-04-27 ENCOUNTER — Emergency Department (HOSPITAL_COMMUNITY)
Admission: EM | Admit: 2019-04-27 | Discharge: 2019-04-27 | Discharge status: Against Medical Advice | Payer: Medicare Other | Attending: Emergency Medicine | Admitting: Emergency Medicine

## 2019-04-27 ENCOUNTER — Other Ambulatory Visit: Payer: Self-pay

## 2019-04-27 ENCOUNTER — Encounter (HOSPITAL_COMMUNITY): Payer: Self-pay | Admitting: Emergency Medicine

## 2019-04-27 DIAGNOSIS — Z760 Encounter for issue of repeat prescription: Secondary | ICD-10-CM | POA: Insufficient documentation

## 2019-04-27 DIAGNOSIS — Z5321 Procedure and treatment not carried out due to patient leaving prior to being seen by health care provider: Secondary | ICD-10-CM | POA: Insufficient documentation

## 2019-04-27 DIAGNOSIS — R51 Headache: Secondary | ICD-10-CM | POA: Diagnosis present

## 2019-04-27 DIAGNOSIS — I10 Essential (primary) hypertension: Secondary | ICD-10-CM | POA: Diagnosis not present

## 2019-04-27 NOTE — ED Triage Notes (Signed)
Pt c/o headache for over week. reports out of her HTN medications and needs a refill.

## 2019-04-28 ENCOUNTER — Encounter (HOSPITAL_COMMUNITY): Payer: Self-pay

## 2019-04-28 ENCOUNTER — Ambulatory Visit (HOSPITAL_COMMUNITY)
Admission: EM | Admit: 2019-04-28 | Discharge: 2019-04-28 | Disposition: A | Discharge status: Home / Self Care | Payer: Medicare Other | Attending: Family Medicine | Admitting: Family Medicine

## 2019-04-28 DIAGNOSIS — I10 Essential (primary) hypertension: Secondary | ICD-10-CM

## 2019-04-28 MED ORDER — LOSARTAN POTASSIUM-HCTZ 100-25 MG PO TABS: 0.5000 | ORAL_TABLET | Freq: Every day | ORAL | 0 refills | Status: AC

## 2019-04-28 NOTE — ED Triage Notes (Signed)
Pt states he has high B/P because he has not gotten his meds. Pt states went to [facility] because his B/P was up.

## 2019-04-29 NOTE — ED Provider Notes (Signed)
[facility]   [id number] 04/28/19 Arrival Time: [id number]  ASSESSMENT & PLAN:  1. Uncontrolled hypertension     Refilled: Meds ordered this encounter  Medications  . losartan-hydrochlorothiazide (HYZAAR) 100-25 MG tablet    Sig: Take 0.5 tablets by mouth daily. For high blood pressure    Dispense:  30 tablet    Refill:  0   Recommend: Follow-up Information    Schedule an appointment as soon as possible for a visit  with Primary Care at [facility].   Specialty: Family Medicine Contact information: [address] [place] [phone]         May f/u here as needed. Reviewed expectations re: course of current medical issues. Questions answered. Outlined signs and symptoms indicating need for more acute intervention. Patient verbalized understanding. After Visit Summary given.   SUBJECTIVE:  Angela Conner is a 48 y.o. female who presents with concerns regarding increased blood pressures. She reports that she has been treated for hypertension in the past. Would like to start back on her HTN medication. Occasional mild headaches; she questions if these are related to increased BP. Headaches last a few hours; generalized; without associated n/v. Tylenol helps. No recent illnesses.  She reports no TIA's, no chest pain on exertion, no dyspnea on exertion, no swelling of ankles, no orthopnea or paroxysmal nocturnal dyspnea and no palpitations.  Social History   Tobacco Use  Smoking Status Former Smoker  Smokeless Tobacco Never Used   ROS: As per HPI. All other systems negative.   OBJECTIVE:  Vitals:   04/28/19 1456  BP: (!) 130/102  Pulse: 85  Resp: 16  Temp: 97.9 F (36.6 C)  TempSrc: Oral  SpO2: 98%    General appearance: alert; no distress Eyes: PERRLA; EOMI HENT: normocephalic; atraumatic Neck: supple Lungs: clear to auscultation bilaterally Heart: regular rate and rhythm without murmer Abdomen: soft,  non-tender; bowel sounds normal Extremities: no edema; symmetrical with no gross deformities Skin: warm and dry Psychological: alert and cooperative; normal mood and affect   Allergies  Allergen Reactions  . Tylenol [Acetaminophen] Other (See Comments)    Stomach problems due to suicidal attempt     Past Medical History:  Diagnosis Date  . Bipolar 1 disorder ([place])   . Hypercholesteremia   . Hypertension   . Schizophrenia [facility])    Social History   Socioeconomic History  . Marital status: Single    Spouse name: Not on file  . Number of children: Not on file  . Years of education: Not on file  . Highest education level: Not on file  Occupational History  . Not on file  Social Needs  . Financial resource strain: Not on file  . Food insecurity    Worry: Not on file    Inability: Not on file  . Transportation needs    Medical: Not on file    Non-medical: Not on file  Tobacco Use  . Smoking status: Former Research scientist (life sciences)  . Smokeless tobacco: Never Used  Substance and Sexual Activity  . Alcohol use: No  . Drug use: Yes    Types: Marijuana  . Sexual activity: Not on file  Lifestyle  . Physical activity    Days per week: Not on file    Minutes per session: Not on file  . Stress: Not on file  Relationships  . Social Herbalist on phone: Not on file    Gets together: Not on file  Attends religious service: Not on file    Active member of club or organization: Not on file    Attends meetings of clubs or organizations: Not on file    Relationship status: Not on file  . Intimate partner violence    Fear of current or ex partner: Not on file    Emotionally abused: Not on file    Physically abused: Not on file    Forced sexual activity: Not on file  Other Topics Concern  . Not on file  Social History Narrative  . Not on file   History reviewed. No pertinent family history. History reviewed. No pertinent surgical history.    Vanessa Kick, MD 04/29/19 1026

## 2019-07-27 ENCOUNTER — Other Ambulatory Visit: Payer: Self-pay

## 2019-07-27 ENCOUNTER — Encounter (HOSPITAL_BASED_OUTPATIENT_CLINIC_OR_DEPARTMENT_OTHER): Payer: Self-pay | Admitting: *Deleted

## 2019-07-27 ENCOUNTER — Emergency Department (HOSPITAL_BASED_OUTPATIENT_CLINIC_OR_DEPARTMENT_OTHER)
Admission: EM | Admit: 2019-07-27 | Discharge: 2019-07-27 | Disposition: A | Discharge status: Home / Self Care | Payer: Medicare Other | Attending: Emergency Medicine | Admitting: Emergency Medicine

## 2019-07-27 DIAGNOSIS — R202 Paresthesia of skin: Secondary | ICD-10-CM | POA: Insufficient documentation

## 2019-07-27 DIAGNOSIS — Z5321 Procedure and treatment not carried out due to patient leaving prior to being seen by health care provider: Secondary | ICD-10-CM | POA: Diagnosis not present

## 2019-07-27 NOTE — ED Triage Notes (Signed)
Tingling in her right arm for a month. Pain in her left arm x 2 month that comes and goes. Headache for 4 hours. States she did not take her BP medication for a week but took it today. She is ambulatory and is able to carry a heavy book bag with no difficulty. Speech is clear.

## 2019-09-16 DIAGNOSIS — I10 Essential (primary) hypertension: Secondary | ICD-10-CM | POA: Diagnosis not present

## 2019-10-18 DIAGNOSIS — F4310 Post-traumatic stress disorder, unspecified: Secondary | ICD-10-CM | POA: Diagnosis not present

## 2019-10-18 DIAGNOSIS — F251 Schizoaffective disorder, depressive type: Secondary | ICD-10-CM | POA: Diagnosis not present

## 2019-11-16 ENCOUNTER — Emergency Department (HOSPITAL_COMMUNITY)
Admission: EM | Admit: 2019-11-16 | Discharge: 2019-11-16 | Disposition: A | Discharge status: Home / Self Care | Payer: Medicare Other | Attending: Emergency Medicine | Admitting: Emergency Medicine

## 2019-11-16 ENCOUNTER — Other Ambulatory Visit: Payer: Self-pay

## 2019-11-16 DIAGNOSIS — R0981 Nasal congestion: Secondary | ICD-10-CM

## 2019-11-16 DIAGNOSIS — R4589 Other symptoms and signs involving emotional state: Secondary | ICD-10-CM | POA: Diagnosis not present

## 2019-11-16 DIAGNOSIS — F918 Other conduct disorders: Secondary | ICD-10-CM | POA: Diagnosis not present

## 2019-11-16 DIAGNOSIS — Z20822 Contact with and (suspected) exposure to covid-19: Secondary | ICD-10-CM | POA: Diagnosis not present

## 2019-11-16 DIAGNOSIS — Z5320 Procedure and treatment not carried out because of patient's decision for unspecified reasons: Secondary | ICD-10-CM | POA: Insufficient documentation

## 2019-11-16 DIAGNOSIS — F251 Schizoaffective disorder, depressive type: Secondary | ICD-10-CM | POA: Insufficient documentation

## 2019-11-16 LAB — CBC WITH DIFFERENTIAL/PLATELET
Abs Immature Granulocytes: 0.01 10*3/uL (ref 0.00–0.07)
Basophils Absolute: 0 10*3/uL (ref 0.0–0.1)
Basophils Relative: 0 %
Eosinophils Absolute: 0.2 10*3/uL (ref 0.0–0.5)
Eosinophils Relative: 5 %
HCT: 35.8 % — ABNORMAL LOW (ref 36.0–46.0)
Hemoglobin: 11.1 g/dL — ABNORMAL LOW (ref 12.0–15.0)
Immature Granulocytes: 0 %
Lymphocytes Relative: 29 %
Lymphs Abs: 1.4 10*3/uL (ref 0.7–4.0)
MCH: 22.6 pg — ABNORMAL LOW (ref 26.0–34.0)
MCHC: 31 g/dL (ref 30.0–36.0)
MCV: 72.9 fL — ABNORMAL LOW (ref 80.0–100.0)
Monocytes Absolute: 0.4 10*3/uL (ref 0.1–1.0)
Monocytes Relative: 8 %
Neutro Abs: 2.7 10*3/uL (ref 1.7–7.7)
Neutrophils Relative %: 58 %
Platelets: 351 10*3/uL (ref 150–400)
RBC: 4.91 MIL/uL (ref 3.87–5.11)
RDW: 14.6 % (ref 11.5–15.5)
WBC: 4.7 10*3/uL (ref 4.0–10.5)
nRBC: 0 % (ref 0.0–0.2)

## 2019-11-16 LAB — BASIC METABOLIC PANEL
Anion gap: 9 (ref 5–15)
BUN: 10 mg/dL (ref 6–20)
CO2: 27 mmol/L (ref 22–32)
Calcium: 10 mg/dL (ref 8.9–10.3)
Chloride: 100 mmol/L (ref 98–111)
Creatinine, Ser: 0.81 mg/dL (ref 0.44–1.00)
GFR calc Af Amer: >60 mL/min (ref >60)
GFR calc non Af Amer: >60 mL/min (ref >60)
Glucose, Bld: 128 mg/dL — ABNORMAL HIGH (ref 70–99)
Potassium: 4 mmol/L (ref 3.5–5.1)
Sodium: 136 mmol/L (ref 135–145)

## 2019-11-16 LAB — RESPIRATORY PANEL BY RT PCR (FLU A&B, COVID)
Influenza A by PCR: NEGATIVE
Influenza B by PCR: NEGATIVE
SARS Coronavirus 2 by RT PCR: NEGATIVE

## 2019-11-16 LAB — ETHANOL: Alcohol, Ethyl (B): <10 mg/dL (ref <10)

## 2019-11-16 MED ORDER — LOSARTAN POTASSIUM 50 MG PO TABS
100.0000 mg | ORAL_TABLET | Freq: Every day | ORAL | Status: DC
  Administered 2019-11-16: 100 mg via ORAL
  Filled 2019-11-16: qty 2

## 2019-11-16 MED ORDER — HALOPERIDOL 5 MG PO TABS
5.0000 mg | ORAL_TABLET | Freq: Once | ORAL | Status: DC
  Filled 2019-11-16: qty 1

## 2019-11-16 MED ORDER — HYDROCHLOROTHIAZIDE 25 MG PO TABS
25.0000 mg | ORAL_TABLET | Freq: Every day | ORAL | Status: DC
  Administered 2019-11-16: 25 mg via ORAL
  Filled 2019-11-16: qty 1

## 2019-11-16 MED ORDER — LORATADINE 10 MG PO TABS: 10.0000 mg | ORAL_TABLET | Freq: Every day | ORAL | Status: DC | PRN

## 2019-11-16 MED ORDER — LOSARTAN POTASSIUM-HCTZ 100-25 MG PO TABS: 0.5000 | ORAL_TABLET | Freq: Every day | ORAL | Status: DC

## 2019-11-16 NOTE — ED Notes (Signed)
Unable to locate in waiting room. Charge knows

## 2019-11-16 NOTE — ED Notes (Signed)
Pt was discharged from the ED. Pt read and understood discharge paperwork. Pt had vital signs completed. Pt conscious, breathing, and A&Ox4. No distress noted. Pt speaking in complete sentences. Pt ambulated out of the ED with a smooth and steady gait. Per Roderic Palau MD pt is cleared to be discharged. All belongings with pt.

## 2019-11-16 NOTE — Progress Notes (Signed)
Pt meets inpatient criteria. Referral information has been sent to the following hospitals for review:  [facility] Details [place] Details [facility] Details [facility] Details [place] Details  [place] Details [facility]  Disposition will continue to assist with inpatient placement needs.   Audree Camel, LCSW, [place] Disposition [facility]/TTS [phone]

## 2019-11-16 NOTE — Progress Notes (Signed)
CSW received a phone call from [facility] asking for updated labs and vitals. CSW faxed over requested information.   Domenic Schwab, MSW, LCSW-A Clinical Disposition Social Worker [facility]/TTS [phone]

## 2019-11-16 NOTE — ED Triage Notes (Signed)
Pt here for evaluation of two years of "dry nose." Sts she has tried nasal spray without relief and has to breathe out of her mouth so her mouth is dry now too. Pt sniffling in triage.

## 2019-11-16 NOTE — Progress Notes (Signed)
CSW received phone call from [facility] extending bed offer.   Patient was medically cleared and psych cleared by Dr. Milton Ferguson, at [facility] ED. CSW called [facility] and [place] and declined the extending bed offers.   Domenic Schwab, MSW, LCSW-A Clinical Disposition Social Worker [facility]/TTS [phone]

## 2019-11-16 NOTE — ED Notes (Signed)
Called for pt in lobby x 1, no answer

## 2019-11-16 NOTE — ED Notes (Signed)
Pt's cell phone placed in belongings bag. Pt given reading glasses from belongings bag.

## 2019-11-16 NOTE — Progress Notes (Signed)
Pt accepted to [facility]     Dr. Jonelle Sports is the attending/accepting provider.    Call report to [place] ED notified.     Pt is voluntary and will be transported by [facility]  Pt is scheduled to arrive at [facility] on 11/17/19 after [place], [place], [place] Disposition CSW [facility]/TTS [phone]

## 2019-11-16 NOTE — ED Provider Notes (Signed)
Patient states that she wants to go home.  I spoke with her and she says she is not suicidal and she is will contract for safety with me and not harm herself.  She is going to follow-up with [facility] to try to get her medicines   Milton Ferguson, MD 11/16/19 [phone]

## 2019-11-16 NOTE — ED Notes (Signed)
P came to desk advising she was walking around outside and she wanted to know if we had called her. Pt moved back to waiting room in epic and awaiting FT room.

## 2019-11-16 NOTE — ED Notes (Signed)
Per Lamarr Lulas SW, pt has been accepted to [facility] - may arrive on 11/17/19 - will just need COVID performed and resulted. Dr Maryan Rued aware. Message sent to Marya Amsler, [place].

## 2019-11-16 NOTE — Discharge Instructions (Addendum)
Stop using Afrin and at this time you can use Flonase as directed and saline spray.  Also okay to use Vaseline around the sides and inside of the nose to help with dry cracking skin.

## 2019-11-16 NOTE — ED Provider Notes (Signed)
[facility] EMERGENCY DEPARTMENT Provider Note   CSN: [id number] Arrival date & time: 11/16/19  0746     History Chief Complaint  Patient presents with  . Nasal Congestion    Angela Conner is a 49 y.o. female.  Patient is a 49 year old female with a history of schizophrenia, bipolar disease, hypertension and chronic nasal congestion who is presenting today with persistent nasal congestion.  Patient states symptoms have now been present for approximately 2 years.  She was seen about 1 year ago in the emergency room for similar symptoms and at that time was using Afrin multiple times daily.  Today she reports that she continues to use Afrin regularly multiple times a day for weeks at a time in addition to Nasonex, Flonase, nasal saline spray, Nettie pot, allergy pills and nothing seems to make her congestion better.  She reports that the congestion is not much worse than what it has been it just is to the point that she cannot take it anymore.  Over a year ago she saw Dr. Redmond Baseman with ENT and they recommended sinus turbinate surgery but at the time she states she chickened out and did not think she wanted to go through surgery but now she reports she would consider it.  She states the nasal congestion is so bad she cannot breathe out of her nose and has to breathe out of her mouth.  Also she has a hard time breathing because she has to wear a mask at work.  Patient states it has gotten so bad she just wants to commit suicide because she cannot take this anymore and that is the one thing she can do to make it go away.  She states she would shoot herself in the head.  The history is provided by the patient.       Past Medical History:  Diagnosis Date  . Bipolar 1 disorder ([place])   . Hypercholesteremia   . Hypertension   . Schizophrenia [facility])     Patient Active Problem List   Diagnosis Date Noted  . Schizoaffective disorder, bipolar type ([place]) 03/30/2018    No past  surgical history on file.   OB History   No obstetric history on file.     No family history on file.  Social History   Tobacco Use  . Smoking status: Former Research scientist (life sciences)  . Smokeless tobacco: Never Used  Substance Use Topics  . Alcohol use: No  . Drug use: Yes    Types: Marijuana    Home Medications Prior to Admission medications   Medication Sig Start Date End Date Taking? Authorizing Provider  aspirin-acetaminophen-caffeine (EXCEDRIN MIGRAINE) [phone] MG tablet Take 2 tablets by mouth every 6 (six) hours as needed for headache.    [provider]  cyclobenzaprine (FLEXERIL) 5 MG tablet Take 1 tablet (5 mg total) by mouth 3 (three) times daily as needed for muscle spasms. 03/12/19   Jaynee Eagles, PA-C  diclofenac (VOLTAREN) 75 MG EC tablet Take 1 tablet (75 mg total) by mouth 2 (two) times daily. 03/12/19   Jaynee Eagles, PA-C  losartan-hydrochlorothiazide (HYZAAR) 100-25 MG tablet Take 0.5 tablets by mouth daily. For high blood pressure 04/28/19   Vanessa Kick, MD    Allergies    Tylenol [acetaminophen]  Review of Systems   Review of Systems  All other systems reviewed and are negative.   Physical Exam Updated Vital Signs BP (!) 179/107   Pulse (!) 104   Temp 98.1 F (  36.7 C) (Oral)   Resp 18   Ht 5\' 7"  (1.702 m)   Wt 95.3 kg   SpO2 100%   BMI 32.89 kg/m   Physical Exam Vitals and nursing note reviewed.  Constitutional:      General: She is not in acute distress.    Appearance: Normal appearance. She is well-developed. She is obese.  HENT:     Head: Normocephalic and atraumatic.     Nose: Mucosal edema and congestion present.     Right Turbinates: Enlarged and swollen.     Left Turbinates: Enlarged and swollen.     Right Sinus: No maxillary sinus tenderness or frontal sinus tenderness.     Left Sinus: No maxillary sinus tenderness or frontal sinus tenderness.      Mouth/Throat:     Mouth: Mucous membranes are dry.  Eyes:     Pupils: Pupils are  equal, round, and reactive to light.  Cardiovascular:     Rate and Rhythm: Normal rate and regular rhythm.     Heart sounds: Normal heart sounds. No murmur. No friction rub.  Pulmonary:     Effort: Pulmonary effort is normal.     Breath sounds: Normal breath sounds. No wheezing or rales.  Abdominal:     General: Bowel sounds are normal. There is no distension.     Palpations: Abdomen is soft.     Tenderness: There is no abdominal tenderness. There is no guarding or rebound.  Musculoskeletal:        General: No tenderness. Normal range of motion.     Comments: No edema  Skin:    General: Skin is warm and dry.     Findings: No rash.  Neurological:     Mental Status: She is alert and oriented to person, place, and time.     Cranial Nerves: No cranial nerve deficit.  Psychiatric:        Behavior: Behavior normal.        Thought Content: Thought content includes suicidal ideation. Thought content includes suicidal plan.     Comments: Patient reports that her nasal congestion is directly related to why she feels suicidal.  She knows that the nasal congestion cannot be fixed quickly and she states she cannot say that she would not hurt herself.     ED Results / Procedures / Treatments   Labs (all labs ordered are listed, but only abnormal results are displayed) Labs Reviewed - No data to display  EKG None  Radiology No results found.  Procedures Procedures (including critical care time)  Medications Ordered in ED Medications - No data to display  ED Course  I have reviewed the triage vital signs and the nursing notes.  Pertinent labs & imaging results that were available during my care of the patient were reviewed by me and considered in my medical decision making (see chart for details).    MDM Rules/Calculators/A&P                      Patient presenting today with nasal congestion which has been chronic now for 2 years.  Saw ENT who recommended sinus surgery but at  the time she did not want to undergo surgery.  Patient is also using Afrin frequently which is most likely causing rebound congestion.  Discussed with her that she needed to stop using the Afrin.  She is using Flonase and Nasonex and various other steroid nasal sprays and recommended that she decrease use to  only 1 and use Vaseline along the edges and inside of her nose to help with the dryness.  Encouraged her to follow back up with ENT for possible sinus surgery.  However this will not be a quick fix.  This is causing her to feel suicidal and she is unwilling to contract for safety.  Will have TTS evaluate to see if there is any further helpful options for her.  1:28 PM TTS evaluated and recommends inpt treatment.  Med clearnace and covid pending.  3:32 PM Labs wnl.  Pt is medically clear and accepted at [facility].  Final Clinical Impression(s) / ED Diagnoses Final diagnoses:  Nasal congestion  Suicidal behavior without attempted self-injury    Rx / DC Orders ED Discharge Orders    None       Blanchie Dessert, MD 11/16/19 [phone]

## 2019-11-16 NOTE — BH Assessment (Signed)
Tele Assessment Note   Patient Name: Angela Conner MRN: [id number] Referring Physician: Blanchie Dessert, MD Location of Patient: Angela [facility] Location of Provider: [place] is a divorced 49 y.o. female who presents voluntarily to [facility]. Pt was initially reporting symptoms of not being able to breath due to existing sinus problem. Pt had been previously scheduled for surgery to ameliorate pain and discomfort but cancelled due to feeling nervous about the procedure. Pt has a history of schizoaffective disorder and suicide attempts. She was referred for assessment by EDP when pt stated the current sinus issues cause her to feel suicidal. Pt reports she is not taking her psychiatric medication. She states she hasn't had her meds in months and they are ready for pick up at [facility]. Pt reports current suicidal ideation with plans of overdosing. Past attempts include "4 or 5". Pt acknowledges multiple symptoms of Depression, including anhedonia, isolating, feelings of worthlessness & guilt, tearfulness, changes in sleep, & increased irritability. Pt denies homicidal ideation/ history of violence. Pt reports worsening command auditory hallucinations. She reports occasional feelings of paranoia related to Angela Conner. Pt states her primary current stressor is not being able to breathe.   Pt lives in her car in her mother's driveway, and supports include her mother. Pt denies hx of abuse. Pt receives disability benefits. Pt has partial insight and judgment. Pt's memory is fair. Legal history includes upcoming court date for running a stop sign.  Protective factors against suicide include good family support.  Pt's OP history includes Angela [facility]ner. IP history includes Angela [facility]ner, most recently 04/2019. Pt reports THC use about twice weekly. She denies other substance abuse. ? MSE: Pt is dressed in scrubs, alert, oriented x4 with normal speech and normal motor behavior. Eye contact is good.  Pt's mood is pleasant and affect is constricted. Affect is congruent with mood. Thought process is coherent and relevant. There is no indication pt is currently responding to internal stimuli or experiencing delusional thought content. Pt was cooperative throughout assessment.   Disposition: [facility] Maes, NP recommends inpt psychiatric tx  Diagnosis: Schizoaffective disorder, depressed  Past Medical History:  Past Medical History:  Diagnosis Date  . Bipolar 1 disorder (Angela Conner)   . Hypercholesteremia   . Hypertension   . Schizophrenia (Angela Conner)     No past surgical history on file.  Family History: No family history on file.  Social History:  reports that she has quit smoking. She has never used smokeless tobacco. She reports current drug use. Drug: Marijuana. She reports that she does not drink alcohol.  Additional Social History:  Alcohol / Drug Use Pain Medications: SEE MAR Prescriptions: SEE MAR; needs to pick up meds at [facility]. Pt reports being off all meds for months Over the Counter: SEE MAR History of alcohol / drug use?: Yes Substance #1 Name of Substance 1: THC 1 - Frequency: couple times weekly 1 - Last Use / Amount: last week  CIWA: CIWA-Ar BP: (!) 151/109 Pulse Rate: 91 COWS:    Allergies:  Allergies  Allergen Reactions  . Tylenol [Acetaminophen] Other (See Comments)    Stomach problems due to suicidal attempt     Home Medications: (Not in a hospital admission)   OB/GYN Status:  No LMP recorded. (Menstrual status: Perimenopausal).  General Assessment Data Location of Assessment: [facility] TTS Assessment: In system Is this a Tele or Face-to-Face Assessment?: Tele Assessment Is this an Initial Assessment or a Re-assessment for this encounter?: Initial Assessment Patient  Accompanied by:: N/A Language Other than English: No Living Arrangements: Other (Comment)(stay in my car in mother's driveway) What gender do you identify as?: Female Marital status:  Divorced Maiden name: Corey Living Arrangements: Alone Can pt return to current living arrangement?: Yes Admission Status: Voluntary Is patient capable of signing voluntary admission?: Yes Referral Source: MD Insurance type: medicare     Crisis Care Plan Living Arrangements: Alone Legal Guardian: (self) Name of Psychiatrist: [place] Name of Therapist: none  Education Status Is patient currently in school?: No Is the patient employed, unemployed or receiving disability?: Receiving disability income  Risk to self with the past 6 months Suicidal Ideation: No-Not Currently/Within Last 6 Months(this morning) Has patient been a risk to self within the past 6 months prior to admission? : No Suicidal Intent: No Has patient had any suicidal intent within the past 6 months prior to admission? : No Is patient at risk for suicide?: Yes Suicidal Plan?: Yes-Currently Present Has patient had any suicidal plan within the past 6 months prior to admission? : Yes Specify Current Suicidal Plan: overdose on pills Access to Means: Yes What has been your use of drugs/alcohol within the last 12 months?: THC Previous Attempts/Gestures: Yes("at least 4") How many times?: 4 Other Self Harm Risks: schizoaffective dx, AVH, depression sx, past attempts Triggers for Past Attempts: Other personal contacts Intentional Self Injurious Behavior: None(used to cut) Family Suicide History: No Recent stressful life event(s): Other (Comment)(difficulty breathing through nose) Persecutory voices/beliefs?: No Depression: Yes Depression Symptoms: Despondent, Insomnia, Tearfulness, Isolating, Fatigue, Guilt, Loss of interest in usual pleasures, Feeling worthless/self pity, Feeling angry/irritable Substance abuse history and/or treatment for substance abuse?: Yes Suicide prevention information given to non-admitted patients: Not applicable  Risk to Others within the past 6 months Homicidal Ideation: No Does  patient have any lifetime risk of violence toward others beyond the six months prior to admission? : No Thoughts of Harm to Others: No Current Homicidal Intent: No Current Homicidal Plan: No Access to Homicidal Means: No Describe Access to Homicidal Means: (brother's gun) History of harm to others?: No Assessment of Violence: None Noted Does patient have access to weapons?: Yes (Comment)(yeah, could get my brother's gun) Does patient have a court date: Yes(in May for not stopping at stop sign) Is patient on probation?: No  Psychosis Hallucinations: Auditory, Visual, With command Delusions: None noted  Mental Status Report Appearance/Hygiene: In scrubs, Unremarkable Eye Contact: Good Motor Activity: Freedom of movement Speech: Logical/coherent Level of Consciousness: Alert Mood: Pleasant Affect: Constricted Anxiety Level: Minimal Thought Processes: Coherent, Relevant Judgement: Partial Orientation: Appropriate for developmental age Obsessive Compulsive Thoughts/Behaviors: None  Cognitive Functioning Concentration: Normal Memory: Recent Intact, Remote Intact Is patient IDD: No Insight: Fair Impulse Control: Good Appetite: Good Have you had any weight changes? : No Change Sleep: Decreased Total Hours of Sleep: 7 Vegetative Symptoms: Staying in bed  ADLScreening [facility] Assessment Services) Patient's cognitive ability adequate to safely complete daily activities?: Yes Patient able to express need for assistance with ADLs?: Yes Independently performs ADLs?: Yes (appropriate for developmental age)  Prior Inpatient Therapy Prior Inpatient Therapy: Yes Prior Therapy Dates: August 2019 Prior Therapy Facilty/Provider(s): [facility] Reason for Treatment: Depression, SI  Prior Outpatient Therapy Prior Outpatient Therapy: Yes Prior Therapy Dates: ongoing Prior Therapy Facilty/Provider(s): [facility] Does patient have an ACCT team?: No Does patient have Intensive In-House  Services?  : No Does patient have [facility] services? : Yes Does patient have P4CC services?: No  ADL Screening (condition at time of  admission) Patient's cognitive ability adequate to safely complete daily activities?: Yes Is the patient deaf or have difficulty hearing?: No Does the patient have difficulty seeing, even when wearing glasses/contacts?: No Does the patient have difficulty concentrating, remembering, or making decisions?: No Patient able to express need for assistance with ADLs?: Yes Does the patient have difficulty dressing or bathing?: No Independently performs ADLs?: Yes (appropriate for developmental age) Does the patient have difficulty walking or climbing stairs?: No Weakness of Legs: None Weakness of Arms/Hands: None  Home Assistive Devices/Equipment Home Assistive Devices/Equipment: None  Therapy Consults (therapy consults require a physician order) PT Evaluation Needed: No OT Evalulation Needed: No SLP Evaluation Needed: No Abuse/Neglect Assessment (Assessment to be complete while patient is alone) Abuse/Neglect Assessment Can Be Completed: Yes Physical Abuse: Denies Verbal Abuse: Denies Sexual Abuse: Denies Exploitation of patient/patient's resources: Denies Self-Neglect: Denies Values / Beliefs Cultural Requests During Hospitalization: None Spiritual Requests During Hospitalization: None Consults Spiritual Care Consult Needed: No Transition of Care Team Consult Needed: No Advance Directives (For Healthcare) Does Patient Have a Medical Advance Directive?: No Would patient like information on creating a medical advance directive?: No - Patient declined          Disposition: Angela Maes, NP recommends inpt psychiatric tx Disposition Initial Assessment Completed for this Encounter: Yes  This service was provided via telemedicine using a 2-way, interactive audio and video technology.   Chike Farrington H Rebecah Dangerfield 11/16/2019 12:01 PM

## 2019-11-16 NOTE — ED Notes (Signed)
Ordered lunch tray for pt, per Dr. Maryan Rued. Pt also given ER bagged lunch and Ginger Ale, per Marya Amsler - RN.

## 2019-11-17 ENCOUNTER — Emergency Department (HOSPITAL_BASED_OUTPATIENT_CLINIC_OR_DEPARTMENT_OTHER)
Admission: EM | Admit: 2019-11-17 | Discharge: 2019-11-17 | Disposition: A | Discharge status: Home / Self Care | Payer: Medicare Other | Attending: Emergency Medicine | Admitting: Emergency Medicine

## 2019-11-17 ENCOUNTER — Other Ambulatory Visit: Payer: Self-pay

## 2019-11-17 ENCOUNTER — Encounter (HOSPITAL_BASED_OUTPATIENT_CLINIC_OR_DEPARTMENT_OTHER): Payer: Self-pay

## 2019-11-17 DIAGNOSIS — Z79899 Other long term (current) drug therapy: Secondary | ICD-10-CM | POA: Diagnosis not present

## 2019-11-17 DIAGNOSIS — R519 Headache, unspecified: Secondary | ICD-10-CM | POA: Diagnosis not present

## 2019-11-17 DIAGNOSIS — Z87891 Personal history of nicotine dependence: Secondary | ICD-10-CM | POA: Diagnosis not present

## 2019-11-17 DIAGNOSIS — M62838 Other muscle spasm: Secondary | ICD-10-CM | POA: Insufficient documentation

## 2019-11-17 DIAGNOSIS — I10 Essential (primary) hypertension: Secondary | ICD-10-CM | POA: Diagnosis not present

## 2019-11-17 DIAGNOSIS — R200 Anesthesia of skin: Secondary | ICD-10-CM | POA: Diagnosis not present

## 2019-11-17 DIAGNOSIS — F1210 Cannabis abuse, uncomplicated: Secondary | ICD-10-CM | POA: Insufficient documentation

## 2019-11-17 MED ORDER — CYCLOBENZAPRINE HCL 5 MG PO TABS: 5.0000 mg | ORAL_TABLET | Freq: Two times a day (BID) | ORAL | 0 refills | Status: DC | PRN

## 2019-11-17 MED ORDER — CYCLOBENZAPRINE HCL 5 MG PO TABS: 5.0000 mg | ORAL_TABLET | Freq: Two times a day (BID) | ORAL | 0 refills | Status: AC | PRN

## 2019-11-17 MED FILL — CYCLOBENZAPRINE HCL 5 MG TA: 5 | 7 days supply | Qty: 15 | Fill #0

## 2019-11-17 NOTE — ED Triage Notes (Addendum)
Pt c/o numbness to tongue and roof of mouth x 40 mins-nausea, abd pain, HA, leg pain "spasms" x 1 hour-pt states she was seen at [facility] ED yesterday for "nasal congestion" and is awaiting covid result-NAD-steady gait

## 2019-11-17 NOTE — ED Provider Notes (Signed)
[facility] EMERGENCY DEPARTMENT Provider Note   CSN: [id number] Arrival date & time: 11/17/19  1528     History Chief Complaint  Patient presents with  . Multiple c/o    Angela Conner is a 49 y.o. female.  Patient here for muscle spasms, tingling of her tongue.  History of the same.  Worse today than normal.  Was seen yesterday in the ED for nasal congestion.  The history is provided by the patient.  Illness Location:  General Quality:  Spasms Severity:  Mild Onset quality:  Gradual Timing:  Intermittent Chronicity:  Chronic Relieved by:  Nothing Worsened by:  Nothing Associated symptoms: no abdominal pain, no chest pain, no cough, no ear pain, no fever, no rash, no shortness of breath, no sore throat and no vomiting        Past Medical History:  Diagnosis Date  . Bipolar 1 disorder ([place])   . Hypercholesteremia   . Hypertension   . Schizophrenia [facility])     Patient Active Problem List   Diagnosis Date Noted  . Schizoaffective disorder, bipolar type ([place]) 03/30/2018    History reviewed. No pertinent surgical history.   OB History   No obstetric history on file.     No family history on file.  Social History   Tobacco Use  . Smoking status: Former Research scientist (life sciences)  . Smokeless tobacco: Never Used  Substance Use Topics  . Alcohol use: No  . Drug use: Yes    Types: Marijuana    Home Medications Prior to Admission medications   Medication Sig Start Date End Date Taking? Authorizing Provider  aspirin-acetaminophen-caffeine (EXCEDRIN MIGRAINE) [phone] MG tablet Take 2 tablets by mouth every 6 (six) hours as needed for headache.    [provider]  cyclobenzaprine (FLEXERIL) 5 MG tablet Take 1 tablet (5 mg total) by mouth 2 (two) times daily as needed for up to 15 doses for muscle spasms. 11/17/19   Audon Heymann, DO  diclofenac (VOLTAREN) 75 MG EC tablet Take 1 tablet (75 mg total) by mouth 2 (two) times daily. Patient not taking:  Reported on 11/16/2019 03/12/19   Jaynee Eagles, PA-C  loratadine (CLARITIN) 10 MG tablet Take 10 mg by mouth daily as needed for allergies.    [provider]  losartan-hydrochlorothiazide (HYZAAR) 100-25 MG tablet Take 0.5 tablets by mouth daily. For high blood pressure 04/28/19   Vanessa Kick, MD  oxymetazoline (AFRIN) 0.05 % nasal spray Place 1 spray into both nostrils 2 (two) times daily as needed for congestion.    [provider]  phenylephrine (NEO-SYNEPHRINE) 0.5 % nasal solution Place 1 drop into both nostrils every 6 (six) hours as needed for congestion.    [provider]    Allergies    Tylenol [acetaminophen]  Review of Systems   Review of Systems  Constitutional: Negative for chills and fever.  HENT: Negative for ear pain and sore throat.   Eyes: Negative for pain and visual disturbance.  Respiratory: Negative for cough and shortness of breath.   Cardiovascular: Negative for chest pain and palpitations.  Gastrointestinal: Negative for abdominal pain and vomiting.  Genitourinary: Negative for dysuria and hematuria.  Musculoskeletal: Negative for arthralgias and back pain.  Skin: Negative for color change and rash.  Neurological: Positive for tremors (spasms throughout) and numbness (tingling tongue). Negative for seizures and syncope.  All other systems reviewed and are negative.   Physical Exam Updated Vital Signs BP (!) 132/94 (BP Location: Right Arm)  Pulse 86   Temp 98 F (36.7 C) (Oral)   Resp 16   Ht 5\' 7"  (1.702 m)   Wt 95.3 kg   SpO2 98%   BMI 32.92 kg/m   Physical Exam Vitals and nursing note reviewed.  Constitutional:      General: She is not in acute distress.    Appearance: She is well-developed.  HENT:     Head: Normocephalic and atraumatic.  Eyes:     Extraocular Movements: Extraocular movements intact.     Conjunctiva/sclera: Conjunctivae normal.     Pupils: Pupils are equal, round, and reactive to light.   Cardiovascular:     Rate and Rhythm: Normal rate and regular rhythm.     Pulses: Normal pulses.     Heart sounds: Normal heart sounds. No murmur.  Pulmonary:     Effort: Pulmonary effort is normal. No respiratory distress.     Breath sounds: Normal breath sounds.  Abdominal:     General: Abdomen is flat.     Palpations: Abdomen is soft.     Tenderness: There is no abdominal tenderness.  Musculoskeletal:     Cervical back: Neck supple.  Skin:    General: Skin is warm and dry.  Neurological:     General: No focal deficit present.     Mental Status: She is alert and oriented to person, place, and time.     Cranial Nerves: No cranial nerve deficit.     Sensory: No sensory deficit.     Motor: No weakness.     Coordination: Coordination normal.     Gait: Gait normal.     Comments: 5+ out of 5 strength, normal sensation, no drift, normal finger-to-nose finger  Psychiatric:        Mood and Affect: Mood normal.     ED Results / Procedures / Treatments   Labs (all labs ordered are listed, but only abnormal results are displayed) Labs Reviewed - No data to display  EKG None  Radiology No results found.  Procedures Procedures (including critical care time)  Medications Ordered in ED Medications - No data to display  ED Course  I have reviewed the triage vital signs and the nursing notes.  Pertinent labs & imaging results that were available during my care of the patient were reviewed by me and considered in my medical decision making (see chart for details).    MDM Rules/Calculators/A&P                      Angela Conner is a 49 year old female with history of bipolar who presents to the ED with muscle spasms.  Patient with unremarkable vitals.  No fever.  Patient states diffuse muscle spasms, some tingling to her tongue.  History of the same.  States that they usually go away but they were bothering her at work today.  Patient was seen yesterday in the ED for  psychiatric problems and nasal congestion.  Had unremarkable electrolytes.  Denies any nausea, vomiting, chest pain, shortness of breath.  Neurologic exam is unremarkable.  No active symptoms currently.  Will trial Flexeril which it appears that she has used in the past.  Discharged in good condition.  Recommend increase hydration.  Given return precautions.  This chart was dictated using voice recognition software.  Despite best efforts to proofread,  errors can occur which can change the documentation meaning.    Final Clinical Impression(s) / ED Diagnoses Final diagnoses:  Muscle spasm  Rx / DC Orders ED Discharge Orders         Ordered    cyclobenzaprine (FLEXERIL) 5 MG tablet  2 times daily PRN     11/17/19 1654           Lennice Sites, DO 11/17/19 1701

## 2020-01-26 ENCOUNTER — Encounter (HOSPITAL_COMMUNITY): Payer: Self-pay | Admitting: Family Medicine

## 2020-01-26 ENCOUNTER — Ambulatory Visit (HOSPITAL_COMMUNITY)
Admission: EM | Admit: 2020-01-26 | Discharge: 2020-01-26 | Disposition: A | Discharge status: Home / Self Care | Payer: Medicare Other | Attending: Family Medicine | Admitting: Family Medicine

## 2020-01-26 DIAGNOSIS — M79651 Pain in right thigh: Secondary | ICD-10-CM

## 2020-01-26 DIAGNOSIS — R200 Anesthesia of skin: Secondary | ICD-10-CM

## 2020-01-26 MED ORDER — KETOROLAC TROMETHAMINE 30 MG/ML IJ SOLN
INTRAMUSCULAR | Status: AC
  Filled 2020-01-26: qty 1

## 2020-01-26 MED ORDER — KETOROLAC TROMETHAMINE 30 MG/ML IJ SOLN
30.0000 mg | Freq: Once | INTRAMUSCULAR | Status: AC
  Administered 2020-01-26: 30 mg via INTRAMUSCULAR

## 2020-01-26 MED ORDER — PREDNISONE 10 MG PO TABS: 20.0000 mg | ORAL_TABLET | Freq: Every day | ORAL | 0 refills | Status: AC

## 2020-01-26 NOTE — Discharge Instructions (Addendum)
Believe this is some sort of nerve or musculoskeletal pain.  Toradol given here for pain.  Prednisone 20 mg x 3 days.  Rest, ice,  Follow up as needed for continued or worsening symptoms

## 2020-01-26 NOTE — ED Triage Notes (Signed)
Pt presents with numbness in her right thigh and fingers x 5 days. Pt states that today it started burning. Denies any injury.

## 2020-01-27 NOTE — ED Provider Notes (Signed)
[place]    CSN: [id number] Arrival date & time: 01/26/20  1421      History   Chief Complaint No chief complaint on file.   HPI Angela Conner is a 49 y.o. female.   Patient is a 49 year old female past medical history of bipolar, schizophrenia, hypertension high cholesterol.  She presents today with right upper thigh pain, burning and numbness. This has been present and worsening for the past 5 days. Some numbness in the right hand and finger. No pain in the hand, no injuries.  Pain is exacerbated by working.  She stands a lot at work and uses her hands.  No leg swelling, erythema.  Pain with ambulation.  No fever, chills or rashes.   ROS per HPI      Past Medical History:  Diagnosis Date  . Bipolar 1 disorder ([place])   . Hypercholesteremia   . Hypertension   . Schizophrenia [facility])     Patient Active Problem List   Diagnosis Date Noted  . Schizoaffective disorder, bipolar type ([place]) 03/30/2018    History reviewed. No pertinent surgical history.  OB History   No obstetric history on file.      Home Medications    Prior to Admission medications   Medication Sig Start Date End Date Taking? Authorizing Provider  losartan-hydrochlorothiazide (HYZAAR) 100-25 MG tablet Take 0.5 tablets by mouth daily. For high blood pressure 04/28/19  Yes Hagler, Aaron Edelman, MD  aspirin-acetaminophen-caffeine (EXCEDRIN MIGRAINE) [phone] MG tablet Take 2 tablets by mouth every 6 (six) hours as needed for headache.    [provider]  cyclobenzaprine (FLEXERIL) 5 MG tablet Take 1 tablet (5 mg total) by mouth 2 (two) times daily as needed for up to 15 doses for muscle spasms. 11/17/19   Curatolo, Adam, DO  loratadine (CLARITIN) 10 MG tablet Take 10 mg by mouth daily as needed for allergies.    [provider]  oxymetazoline (AFRIN) 0.05 % nasal spray Place 1 spray into both nostrils 2 (two) times daily as needed for congestion.    [provider]    phenylephrine (NEO-SYNEPHRINE) 0.5 % nasal solution Place 1 drop into both nostrils every 6 (six) hours as needed for congestion.    [provider]  predniSONE (DELTASONE) 10 MG tablet Take 2 tablets (20 mg total) by mouth daily for 3 days. 01/26/20 01/29/20  Orvan July, NP    Family History Family History  Problem Relation Age of Onset  . Diabetes Mother   . Healthy Father     Social History Social History   Tobacco Use  . Smoking status: Former Research scientist (life sciences)  . Smokeless tobacco: Never Used  Substance Use Topics  . Alcohol use: No  . Drug use: Yes    Types: Marijuana     Allergies   Tylenol [acetaminophen]   Review of Systems Review of Systems   Physical Exam Triage Vital Signs ED Triage Vitals  Enc Vitals Group     BP 01/26/20 1456 126/85     Pulse Rate 01/26/20 1456 100     Resp 01/26/20 1456 16     Temp 01/26/20 1456 98 F (36.7 C)     Temp Source 01/26/20 1456 Oral     SpO2 01/26/20 1456 99 %     Weight --      Height --      Head Circumference --      Peak Flow --      Pain Score 01/26/20 1545  6     Pain Loc --      Pain Edu? --      Excl. in [place]? --    No data found.  Updated Vital Signs BP 126/85 (BP Location: Left Arm)   Pulse 100   Temp 98 F (36.7 C) (Oral)   Resp 16   SpO2 99%   Visual Acuity Right Eye Distance:   Left Eye Distance:   Bilateral Distance:    Right Eye Near:   Left Eye Near:    Bilateral Near:     Physical Exam Vitals and nursing note reviewed.  Constitutional:      General: She is not in acute distress.    Appearance: Normal appearance. She is not ill-appearing, toxic-appearing or diaphoretic.  HENT:     Head: Normocephalic.     Nose: Nose normal.  Eyes:     Conjunctiva/sclera: Conjunctivae normal.  Neck:     Comments: No cervical tenderness.  Normal ROM of the neck.  Pulmonary:     Effort: Pulmonary effort is normal.  Musculoskeletal:        General: Normal range of motion.     Cervical back:  Normal range of motion.       Legs:     Comments: TTP without redness, swelling, bruising, rash, deformity.  Pt holding leg in this area.   Right hand appears normal without swelling, tenderness, erythema. Good ROM.  2 + radial pulse.   Skin:    General: Skin is warm and dry.     Findings: No rash.  Neurological:     Mental Status: She is alert.  Psychiatric:        Mood and Affect: Mood normal.      UC Treatments / Results  Labs (all labs ordered are listed, but only abnormal results are displayed) Labs Reviewed - No data to display  EKG   Radiology No results found.  Procedures Procedures (including critical care time)  Medications Ordered in UC Medications  ketorolac (TORADOL) 30 MG/ML injection 30 mg (30 mg Intramuscular Given 01/26/20 1556)    Initial Impression / Assessment and Plan / UC Course  I have reviewed the triage vital signs and the nursing notes.  Pertinent labs & imaging results that were available during my care of the patient were reviewed by me and considered in my medical decision making (see chart for details).     Pain of right thigh with radiculopathy Treating with Toradol here for acute pain.  Prednisone low-dose x3 days Rest, ice the area Follow up as needed for continued or worsening symptoms  Final Clinical Impressions(s) / UC Diagnoses   Final diagnoses:  Pain of right thigh  Numbness     Discharge Instructions     Believe this is some sort of nerve or musculoskeletal pain.  Toradol given here for pain.  Prednisone 20 mg x 3 days.  Rest, ice,  Follow up as needed for continued or worsening symptoms     ED Prescriptions    Medication Sig Dispense Auth. Provider   predniSONE (DELTASONE) 10 MG tablet Take 2 tablets (20 mg total) by mouth daily for 3 days. 6 tablet Loura Halt A, NP     PDMP not reviewed this encounter.   Loura Halt A, NP 01/28/20 [phone]

## 2020-05-10 DIAGNOSIS — Z0000 Encounter for general adult medical examination without abnormal findings: Secondary | ICD-10-CM | POA: Diagnosis not present

## 2020-05-10 DIAGNOSIS — I10 Essential (primary) hypertension: Secondary | ICD-10-CM | POA: Diagnosis not present

## 2020-05-10 DIAGNOSIS — D509 Iron deficiency anemia, unspecified: Secondary | ICD-10-CM | POA: Diagnosis not present

## 2020-05-10 DIAGNOSIS — Z590 Homelessness: Secondary | ICD-10-CM | POA: Diagnosis not present

## 2020-05-10 DIAGNOSIS — F329 Major depressive disorder, single episode, unspecified: Secondary | ICD-10-CM | POA: Diagnosis not present
